# Patient Record
Sex: Male | Born: 1950 | Race: White | Hispanic: No | Marital: Single | Smoking: Former smoker
Health system: Southern US, Community
[De-identification: ages and names within clinical notes are randomized; demographics above are authoritative.]

## PROBLEM LIST (undated history)

## (undated) DIAGNOSIS — E2749 Other adrenocortical insufficiency: Secondary | ICD-10-CM

## (undated) DIAGNOSIS — E7800 Pure hypercholesterolemia, unspecified: Secondary | ICD-10-CM

## (undated) DIAGNOSIS — E230 Hypopituitarism: Secondary | ICD-10-CM

## (undated) DIAGNOSIS — R251 Tremor, unspecified: Secondary | ICD-10-CM

## (undated) DIAGNOSIS — R51 Headache: Secondary | ICD-10-CM

## (undated) DIAGNOSIS — T8859XA Other complications of anesthesia, initial encounter: Secondary | ICD-10-CM

## (undated) DIAGNOSIS — R112 Nausea with vomiting, unspecified: Secondary | ICD-10-CM

## (undated) DIAGNOSIS — Z79899 Other long term (current) drug therapy: Secondary | ICD-10-CM

## (undated) DIAGNOSIS — D444 Neoplasm of uncertain behavior of craniopharyngeal duct: Secondary | ICD-10-CM

## (undated) DIAGNOSIS — R439 Unspecified disturbances of smell and taste: Secondary | ICD-10-CM

## (undated) DIAGNOSIS — R430 Anosmia: Secondary | ICD-10-CM

## (undated) DIAGNOSIS — E039 Hypothyroidism, unspecified: Secondary | ICD-10-CM

## (undated) DIAGNOSIS — N200 Calculus of kidney: Secondary | ICD-10-CM

## (undated) DIAGNOSIS — E291 Testicular hypofunction: Secondary | ICD-10-CM

## (undated) DIAGNOSIS — T4145XA Adverse effect of unspecified anesthetic, initial encounter: Secondary | ICD-10-CM

## (undated) DIAGNOSIS — Z1211 Encounter for screening for malignant neoplasm of colon: Secondary | ICD-10-CM

## (undated) DIAGNOSIS — Z0000 Encounter for general adult medical examination without abnormal findings: Secondary | ICD-10-CM

## (undated) DIAGNOSIS — D443 Neoplasm of uncertain behavior of pituitary gland: Secondary | ICD-10-CM

## (undated) DIAGNOSIS — Z9889 Other specified postprocedural states: Secondary | ICD-10-CM

## (undated) DIAGNOSIS — M8580 Other specified disorders of bone density and structure, unspecified site: Secondary | ICD-10-CM

## (undated) DIAGNOSIS — M5430 Sciatica, unspecified side: Secondary | ICD-10-CM

## (undated) DIAGNOSIS — R259 Unspecified abnormal involuntary movements: Secondary | ICD-10-CM

## (undated) HISTORY — DX: Neoplasm of uncertain behavior of craniopharyngeal duct: D44.4

## (undated) HISTORY — DX: Other adrenocortical insufficiency: E27.49

## (undated) HISTORY — PX: CRANIOTOMY: SHX93

## (undated) HISTORY — DX: Headache: R51

## (undated) HISTORY — DX: Neoplasm of uncertain behavior of pituitary gland: D44.3

## (undated) HISTORY — DX: Testicular hypofunction: E29.1

## (undated) HISTORY — DX: Encounter for screening for malignant neoplasm of colon: Z12.11

## (undated) HISTORY — DX: Hypopituitarism: E23.0

## (undated) HISTORY — DX: Other long term (current) drug therapy: Z79.899

## (undated) HISTORY — DX: Calculus of kidney: N20.0

## (undated) HISTORY — DX: Anosmia: R43.0

## (undated) HISTORY — DX: Other specified disorders of bone density and structure, unspecified site: M85.80

## (undated) HISTORY — DX: Unspecified abnormal involuntary movements: R25.9

## (undated) HISTORY — DX: Hypothyroidism, unspecified: E03.9

## (undated) HISTORY — DX: Pure hypercholesterolemia, unspecified: E78.00

## (undated) HISTORY — DX: Encounter for general adult medical examination without abnormal findings: Z00.00

## (undated) HISTORY — DX: Tremor, unspecified: R25.1

## (undated) HISTORY — DX: Sciatica, unspecified side: M54.30

## (undated) HISTORY — DX: Unspecified disturbances of smell and taste: R43.9

## (undated) HISTORY — PX: OTHER SURGICAL HISTORY: SHX169

---

## 2001-12-24 ENCOUNTER — Encounter: Payer: Self-pay | Admitting: Ophthalmology

## 2001-12-24 ENCOUNTER — Ambulatory Visit (HOSPITAL_COMMUNITY): Admission: RE | Admit: 2001-12-24 | Discharge: 2001-12-24 | Payer: Self-pay | Admitting: Ophthalmology

## 2001-12-27 ENCOUNTER — Encounter: Payer: Self-pay | Admitting: Neurosurgery

## 2001-12-31 ENCOUNTER — Inpatient Hospital Stay (HOSPITAL_COMMUNITY): Admission: RE | Admit: 2001-12-31 | Discharge: 2002-01-07 | Payer: Self-pay | Admitting: Neurosurgery

## 2002-01-08 ENCOUNTER — Inpatient Hospital Stay (HOSPITAL_COMMUNITY): Admission: AD | Admit: 2002-01-08 | Discharge: 2002-01-11 | Payer: Self-pay | Admitting: Neurosurgery

## 2002-11-03 ENCOUNTER — Encounter: Payer: Self-pay | Admitting: Neurosurgery

## 2002-11-05 ENCOUNTER — Inpatient Hospital Stay (HOSPITAL_COMMUNITY): Admission: RE | Admit: 2002-11-05 | Discharge: 2002-11-14 | Payer: Self-pay | Admitting: Neurosurgery

## 2002-11-05 ENCOUNTER — Encounter: Payer: Self-pay | Admitting: Neurosurgery

## 2003-11-25 ENCOUNTER — Emergency Department (HOSPITAL_COMMUNITY): Admission: EM | Admit: 2003-11-25 | Discharge: 2003-11-25 | Payer: Self-pay | Admitting: Emergency Medicine

## 2005-11-20 HISTORY — PX: OTHER SURGICAL HISTORY: SHX169

## 2006-06-15 ENCOUNTER — Inpatient Hospital Stay (HOSPITAL_COMMUNITY): Admission: RE | Admit: 2006-06-15 | Discharge: 2006-07-01 | Payer: Self-pay | Admitting: Neurosurgery

## 2006-06-15 ENCOUNTER — Encounter (INDEPENDENT_AMBULATORY_CARE_PROVIDER_SITE_OTHER): Payer: Self-pay | Admitting: *Deleted

## 2010-02-17 ENCOUNTER — Encounter: Payer: Self-pay | Admitting: Family Medicine

## 2010-02-17 LAB — CONVERTED CEMR LAB
ALT: 31 units/L
AST: 25 units/L
Alkaline Phosphatase: 38 units/L
Cholesterol: 147 mg/dL
Direct LDL: 81 mg/dL
HDL: 44 mg/dL
Triglycerides: 145 mg/dL

## 2010-05-09 ENCOUNTER — Encounter: Admission: RE | Admit: 2010-05-09 | Discharge: 2010-05-09 | Payer: Self-pay | Admitting: Neurosurgery

## 2010-12-11 ENCOUNTER — Encounter: Payer: Self-pay | Admitting: Neurosurgery

## 2011-01-06 ENCOUNTER — Encounter: Payer: Self-pay | Admitting: Family Medicine

## 2011-01-20 ENCOUNTER — Encounter: Payer: Self-pay | Admitting: Family Medicine

## 2011-01-20 ENCOUNTER — Encounter (INDEPENDENT_AMBULATORY_CARE_PROVIDER_SITE_OTHER): Payer: BC Managed Care – PPO | Admitting: Family Medicine

## 2011-01-20 ENCOUNTER — Encounter (INDEPENDENT_AMBULATORY_CARE_PROVIDER_SITE_OTHER): Payer: Self-pay | Admitting: *Deleted

## 2011-01-20 DIAGNOSIS — J45909 Unspecified asthma, uncomplicated: Secondary | ICD-10-CM | POA: Insufficient documentation

## 2011-01-20 DIAGNOSIS — R519 Headache, unspecified: Secondary | ICD-10-CM | POA: Insufficient documentation

## 2011-01-20 DIAGNOSIS — M5430 Sciatica, unspecified side: Secondary | ICD-10-CM | POA: Insufficient documentation

## 2011-01-20 DIAGNOSIS — D444 Neoplasm of uncertain behavior of craniopharyngeal duct: Secondary | ICD-10-CM

## 2011-01-20 DIAGNOSIS — E291 Testicular hypofunction: Secondary | ICD-10-CM | POA: Insufficient documentation

## 2011-01-20 DIAGNOSIS — E7800 Pure hypercholesterolemia, unspecified: Secondary | ICD-10-CM | POA: Insufficient documentation

## 2011-01-20 DIAGNOSIS — R51 Headache: Secondary | ICD-10-CM | POA: Insufficient documentation

## 2011-01-20 DIAGNOSIS — Z0000 Encounter for general adult medical examination without abnormal findings: Secondary | ICD-10-CM

## 2011-01-20 DIAGNOSIS — M899 Disorder of bone, unspecified: Secondary | ICD-10-CM | POA: Insufficient documentation

## 2011-01-20 DIAGNOSIS — R439 Unspecified disturbances of smell and taste: Secondary | ICD-10-CM | POA: Insufficient documentation

## 2011-01-20 DIAGNOSIS — E230 Hypopituitarism: Secondary | ICD-10-CM | POA: Insufficient documentation

## 2011-01-20 DIAGNOSIS — E039 Hypothyroidism, unspecified: Secondary | ICD-10-CM | POA: Insufficient documentation

## 2011-01-20 DIAGNOSIS — D443 Neoplasm of uncertain behavior of pituitary gland: Secondary | ICD-10-CM | POA: Insufficient documentation

## 2011-01-20 DIAGNOSIS — E2749 Other adrenocortical insufficiency: Secondary | ICD-10-CM | POA: Insufficient documentation

## 2011-01-20 DIAGNOSIS — R251 Tremor, unspecified: Secondary | ICD-10-CM | POA: Insufficient documentation

## 2011-01-20 DIAGNOSIS — M949 Disorder of cartilage, unspecified: Secondary | ICD-10-CM

## 2011-01-20 DIAGNOSIS — Z87442 Personal history of urinary calculi: Secondary | ICD-10-CM | POA: Insufficient documentation

## 2011-01-24 ENCOUNTER — Encounter: Payer: Self-pay | Admitting: Family Medicine

## 2011-01-26 NOTE — Assessment & Plan Note (Signed)
Summary: CPX/PT TRANSFER FROM [facility]   Vital Signs:  Patient profile:   60 year old male Height:      69 inches Weight:      214.25 pounds BMI:     31.75 Temp:     98.4 degrees F oral Pulse rate:   64 / minute Pulse rhythm:   regular BP sitting:   114 / 70  (left arm) Cuff size:   large  Vitals Entered By: Delilah Shan CMA Duncan Dull) (January 20, 2011 8:42 AM) CC: CPX - Transfer from [place]   History of Present Illness: Here to est care/CPE.  See plan.   Panhypopit and h/o crainopharyngioma.  Has had follow up with Dr. Channing Mutters and is doing well. He didn't get a MRI at last OV.  He had one last June and the lesion wasn't expanding.  Has still been following with Dr. Talmage Nap.  DXA per endo.  Anosmia continues and this has been a problem for the patient.    Elevated Cholesterol: Using medications without problems:yes Muscle aches: no Other complaints:no.  due for labs.   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 Talmage Nap SCREENING, COLON CANCER (ICD-V76.51) PHYSICAL EXAMINATION (ICD-V70.0) HYPERCHOLESTEROLEMIA (ICD-272.0) Nephrolithiasis, hx of Frequent headaches H/o asthma Osteopenia  Balan- endo Roy-neurosurg Rankin-ophtho  Past Surgical History: craniotomy x3- Dr. Karma Greaser knife Apr 03, 2006 L middle finger mallet injury  Family History: Reviewed history and no changes required. aunt was his adopted mother, dead Apr 03, 2010 from leukemia F- no information biological mother, healthy 6 biological sibs, 2 with breast cancer, both in remission  Social History: Reviewed history and no changes required. 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  Otherwise negative.    Physical Exam  General:  GEN: nad, alert and oriented HEENT: mucous membranes moist NECK: supple w/o LA CV: rrr.  no murmur PULM: ctab, no inc wob ABD: soft, +bs EXT: no edema SKIN: no acute rash  mild tremor noted in head/neck but not in arms/hands Prostate:  Prostate gland firm and smooth, no enlargement, nodularity, tenderness, mass, asymmetry or induration.   Impression & Recommendations:  Problem # 1:  PHYSICAL EXAMINATION (ICD-V70.0) d/w patient about healthy diet and exercise.  he is likely up to date on tetanus per Dr. Talmage Nap.  Requesting records.  refer to GI for colon CA screen.  Flu and PNA up to date.  Will arrange for labs.    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  per endo  Problem # 4:  HYPERCHOLESTEROLEMIA (ICD-272.0)  No change in meds.  Labs to be done soon.  His updated medication list for this problem includes:    Pravastatin Sodium 40 Mg Tabs (Pravastatin sodium) .Marland Kitchen... Take 1 tablet by mouth once a day  Orders: Prescription Created Electronically [phone])  Problem # 5:  LONG-TERM (CURRENT) USE OF OTHER MEDICATIONS (ICD-V58.69) DRE wnl, check psa since he is on testosterone.   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 Channing Mutters.   Complete Medication List: 1)  Hydrocortisone 20 Mg Tabs (Hydrocortisone) .... Take 1 tablet by  mouth every morning (double this if physically stressed) 2)  Hydrocortisone 10 Mg Tabs (Hydrocortisone) .... Take 1 tablet each evening (double dose if physically stressed) 3)  Pravastatin Sodium 40 Mg Tabs (Pravastatin sodium) .... Take 1 tablet by mouth once a day 4)  Bromocriptine Mesylate 2.5 Mg Tabs (Bromocriptine mesylate) .... Take 1 tablet by  mouth once a day 5)  Lyrica 75 Mg Caps (Pregabalin) .... Take 1 tablet by mouth two times a day 6)  Synthroid 88 Mcg Tabs (Levothyroxine sodium) .... Take 1 tablet by mouth once a day 7)  Hydrocodone-acetaminophen 5-500 Mg Tabs (Hydrocodone-acetaminophen) .... Take 1/2 tablet by mouth three times a day as needed 8)  Centrum Silver Ultra Mens Tabs (Multiple vitamins-minerals) .... Take 1 tablet by mouth once a day 9)  Calcium Citrate-vitamin D 250-50 Mg-unit Tabs (Calcium citrate-vitamin d) .Marland Kitchen.. 1260 mg. calcium/1000 international units vitamin d once daily 10)  Aspirin 81 Mg Tbec (Aspirin) .... Take 1 tablet by mouth once a day 11)  Depo-testosterone 100 Mg/ml Oil (Testosterone cypionate) .... ? strength     0.3 ml injection every 10 days 12)  Cyclobenzaprine Hcl 10 Mg Tabs (Cyclobenzaprine hcl) .... As needed, rarely ever 13)  Dexamethasone Intensol 1 Mg/ml Conc (Dexamethasone) .... Syringe loaded in pocket to be injected in thigh if not responding.  Patient Instructions: 1)  Don't change your meds.  Call about getting your labs done at [facility]. 2)  cmet/lipid 272.0 3)  PSA v58.69.  4)  See Shirlee Limerick about your referral before your leave today.   5)  OV in 6 months.  6)  Try to walk more and cut back on sugary foods.   7)  Glad to see you.  Call me with concerns.  Take care.  Prescriptions: PRAVASTATIN SODIUM 40 MG TABS (PRAVASTATIN SODIUM) Take 1 tablet by mouth once a day  #90 x 3   Entered and Authorized by:   Crawford Givens MD   Signed by:   Crawford Givens MD on 01/20/2011   Method used:   Electronically to        [facility]* (retail)       [address]       [place], [place]  [id number]       Ph: [phone]       Fax: [phone]   RxID:   [id number]    Orders Added: 1)  Est. Patient 40-64 years [99396] 2)  Est. Patient Level III [43329] 3)  Gastroenterology Referral [GI] 4)  Prescription Created Electronically [phone]   Immunization  History:  Pneumovax Immunization History:    Pneumovax:  historical (11/20/2009)  Influenza Immunization History:    Influenza:  historical (08/20/2010)   Immunization History:  Pneumovax Immunization History:    Pneumovax:  Historical (11/20/2009)  Influenza Immunization History:    Influenza:  Historical (08/20/2010)  Current Allergies (reviewed today): No known allergies

## 2011-01-26 NOTE — Letter (Signed)
Summary: Pre Visit Letter Revised  [facility]  [address] [place], [place] [address]   Phone: [phone]  Fax: [phone]        01/20/2011 MRN: [id number] [facility] [address] [place], [place]  [address]  [place]             Procedure Date:  02-20-11           Direct Colon---Dr. Christella Hartigan   Welcome to the Gastroenterology Division at [facility].    You are scheduled to see a nurse for your pre-procedure visit on 02-06-11 at 4:30p.m. on the 3rd floor at [facility], [address].  We ask that you try to arrive at our office 15 minutes prior to your appointment time to allow for check-in.  Please take a minute to review the attached form.  If you answer "Yes" to one or more of the questions on the first page, we ask that you call the person listed at your earliest opportunity.  If you answer "No" to all of the questions, please complete the rest of the form and bring it to your appointment.    Your nurse visit will consist of discussing your medical and surgical history, your immediate family medical history, and your medications.   If you are unable to list all of your medications on the form, please bring the medication bottles to your appointment and we will list them.  We will need to be aware of both prescribed and over the counter drugs.  We will need to know exact dosage information as well.    Please be prepared to read and sign documents such as consent forms, a financial agreement, and acknowledgement forms.  If necessary, and with your consent, a friend or relative is welcome to sit-in on the nurse visit with you.  Please bring your insurance card so that we may make a copy of it.  If your insurance requires a referral to see a specialist, please bring your referral form from your primary care physician.  No co-pay is required for this nurse visit.     If you cannot keep your appointment, please call [phone] to cancel or reschedule  prior to your appointment date.  This allows [place] the opportunity to schedule an appointment for another patient in need of care.    Thank you for choosing [facility] for your medical needs.  We appreciate the opportunity to care for you.  Please visit [place] at our website  to learn more about our practice.  Sincerely, The Gastroenterology Division

## 2011-01-31 NOTE — Letter (Signed)
Summary: Records from [facility] 2007 - 2011  Records from [facility] 2007 - 2011   Imported By: Maryln Gottron 01/26/2011 14:40:52  _____________________________________________________________________  External Attachment:    Type:   Image     Comment:   External Document

## 2011-01-31 NOTE — Miscellaneous (Signed)
  Clinical Lists Changes  Observations: Added new observation of SGPT (ALT): 31 units/L (02/17/2010 15:53) Added new observation of SGOT (AST): 25 units/L (02/17/2010 15:53) Added new observation of ALK PHOS: 38 units/L (02/17/2010 15:53) Added new observation of LDL DIR: 81 mg/dL (81/19/1478 29:56) Added new observation of HDL: 44 mg/dL (21/30/8657 84:69) Added new observation of TRIGLYC TOT: 145 mg/dL (62/95/2841 32:44) Added new observation of CHOLESTEROL: 147 mg/dL (11/22/7251 66:44)

## 2011-02-06 ENCOUNTER — Encounter (INDEPENDENT_AMBULATORY_CARE_PROVIDER_SITE_OTHER): Payer: Self-pay | Admitting: *Deleted

## 2011-02-06 ENCOUNTER — Encounter: Payer: Self-pay | Admitting: *Deleted

## 2011-02-16 NOTE — Letter (Signed)
Summary: New Patient letter  [facility]  [address] [place], [place] [address]   Phone: [phone]  Fax: [phone]       02/06/2011 MRN: [id number]  [facility] [address] [place], [place]  [address]  [place]  Dear [name],  Welcome to the Gastroenterology Division at [facility].    You are scheduled to see Dr.  Christella Hartigan on 03-20-11 at 9:00A.M. on the 3rd floor at [facility], [address].  We ask that you try to arrive at our office 15 minutes prior to your appointment time to allow for check-in.  We would like you to complete the enclosed self-administered evaluation form prior to your visit and bring it with you on the day of your appointment.  We will review it with you.  Also, please bring a complete list of all your medications or, if you prefer, bring the medication bottles and we will list them.  Please bring your insurance card so that we may make a copy of it.  If your insurance requires a referral to see a specialist, please bring your referral form from your primary care physician.  Co-payments are due at the time of your visit and may be paid by cash, check or credit card.     Your office visit will consist of a consult with your physician (includes a physical exam), any laboratory testing he/she may order, scheduling of any necessary diagnostic testing (e.g. x-ray, ultrasound, CT-scan), and scheduling of a procedure (e.g. Endoscopy, Colonoscopy) if required.  Please allow enough time on your schedule to allow for any/all of these possibilities.    If you cannot keep your appointment, please call [phone] to cancel or reschedule prior to your appointment date.  This allows [place] the opportunity to schedule an appointment for another patient in need of care.  If you do not cancel or reschedule by 5 p.m. the business day prior to your appointment date, you will be charged a $50.00 late cancellation/no-show fee.    Thank you for  choosing [facility] for your medical needs.  We appreciate the opportunity to care for you.  Please visit [place] at our website  to learn more about our practice.                     Sincerely,                                                             The Gastroenterology Division

## 2011-02-16 NOTE — Miscellaneous (Signed)
Summary: [facility] PV/needs OV  Clinical Lists Changes  Observations: Added new observation of NKA: T (02/06/2011 16:01)    Pt here for PV for screening colonoscopy.  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 2003, and 2005.  Radiation in 2007.  Pt is on multiple drugs for management of tumor.  Pt has concerns about nausea w/ sedation. He also says that he is unable to take his daily Prednisone if he is not eating and wants injection during the time before procedure that he is not able to eat.  Pt's colonoscopy was cancelled and OV w/ Dr. Christella Hartigan was scheduled for 03/20/2011 at 9:00 am.

## 2011-02-20 ENCOUNTER — Other Ambulatory Visit: Payer: BC Managed Care – PPO | Admitting: Gastroenterology

## 2011-03-15 DIAGNOSIS — Z1211 Encounter for screening for malignant neoplasm of colon: Secondary | ICD-10-CM

## 2011-03-15 NOTE — Progress Notes (Signed)
error    This encounter was created in error - please disregard.

## 2011-03-20 ENCOUNTER — Ambulatory Visit: Payer: BC Managed Care – PPO | Admitting: Gastroenterology

## 2011-03-28 ENCOUNTER — Encounter: Payer: Self-pay | Admitting: Family Medicine

## 2011-04-07 NOTE — H&P (Signed)
NAME:  Brian Stokes, Brian Stokes NO.:  [id number]   MEDICAL RECORD NO.:  [id number]          PATIENT TYPE:  INP   LOCATION:  NA                           FACILITY:  [facility]   PHYSICIAN:  Payton Doughty, M.D.      DATE OF BIRTH:  Apr 08, 1951   DATE OF ADMISSION:  06/15/2006  DATE OF DISCHARGE:                                HISTORY & PHYSICAL   ADMISSION DIAGNOSIS:  Recurrent pituitary tumor.   BODY OF TEXT:  A very nice 60 year old right-handed white gentleman whom I  operated on 4-1/2 years ago for a pituitary tumor via a left temporal  craniotomy.  Subsequently 8 months later he had a transsphenoidal procedure.  He had good decompression but has had increased tumor size and compression  of the optic chiasm.  He is being set up for Gamma knife and is now admitted  for craniotomy to allow a margin of safety between the tumor and the optic  chiasm for Gamma knife.   His medical history is otherwise fairly unremarkable.   He takes hydrocortisone, Synthroid, testosterone, Pravastatin.   He has no allergies.   SURGICAL HISTORY:  Prior pituitary operations.   PHYSICAL EXAMINATION:  HEENT:  Within normal limits.  NECK:  He has good range of motion of his neck.  CHEST:  Clear.  CARDIAC:  Regular rate and rhythm.  NEUROLOGIC:  He is awake, alert and oriented.  He has a left temporal field  cut of approximately 15-30 degrees to confrontational testing.  The right  side appears to be normal.  His pupils equal, round, and reactive to light.  Extraocular movements are intact.  Facial movement and sensation intact.  Tongue protrudes in the midline.  Shoulder shrug is normal.  Motor exam is  5/5 throughout with no drift.   MR shows pituitary tumor with elevation of the chiasm.   CLINICAL IMPRESSION:  Recurrent pituitary adenoma.   PLAN:  Craniotomy to get the tumor off of the chiasm.  The risks and  benefits of this approach have been discussed with him and he wishes to   proceed.           ______________________________  Payton Doughty, M.D.     MWR/MEDQ  D:  06/15/2006  T:  06/15/2006  Job:  [id number]

## 2011-04-07 NOTE — Discharge Summary (Addendum)
Brian Stokes, Brian Stokes NO.:  [id number]   MEDICAL RECORD NO.:  [id number]          PATIENT TYPE:  INP   LOCATION:  3021                         FACILITY:  [facility]   PHYSICIAN:  Payton Doughty, M.D.      DATE OF BIRTH:  03-30-1951   DATE OF ADMISSION:  06/15/2006  DATE OF DISCHARGE:                                 DISCHARGE SUMMARY   DISCHARGE DATE:  July 01, 2006   ADMITTING DIAGNOSIS:  Recurrent pituitary tumor.   DISCHARGE DIAGNOSIS:  Recurrent pituitary tumor.   PROCEDURE:  Right pterional craniotomy for tumor resection.   BODY OF TEXT:  This is a 60 year old right-handed white gentleman whose  history and physical is recounted on the chart.  He has had a left  craniotomy and a transsphenoidal operation for pituitary tumor.  Has been  set up for gamma knife but has some tumor compressing his chiasm, and for  safety was going to have a resection of this portion of the tumor so that he  could go for gamma knife.  Medical history is otherwise unremarkable.  Takes  hydrazone, Synthroid, testosterone, pravastatin.   ALLERGIES:  None.   General exam was intact.  Neurologic exam was intact with a slight amount of  visual deficit in the right eye.  He was admitted after ____ QA MARKER: 60                                               ____ values and underwent the right pterional craniotomy for  the tumor.  The optic chiasm was well decompressed postoperatively.  He had  a fair amount of headache and pain and nausea and vomiting that persisted  for about a 10-day period.  It was gotten under control finally with IV  Zofran.  His repeat CT scan demonstrated a small amount of subdural blood  but nothing requiring operative intervention.  He also developed pain in his  back and some left sciatica.  CT scanning of the lumbar spine simply  demonstrated a lot of facet arthropathy and starting him on Lyrica made a  big difference for this.  Finally on June 28, 2006, it  was feasible to  taper his Decadron, was tapered down to 2 and then off yesterday.  Currently  he is awake, alert, and oriented.  His visual fields are full with slight  blurriness of the vision in the right eye.  He is back on his baseline  medications of hydrocortisone, Synthroid, testosterone, and bromocriptine.  He is being discharged home to the care of his family and to have an MRI  arranged in about a week for gamma knife.           ______________________________  Payton Doughty, M.D.     MWR/MEDQ  D:  07/01/2006  T:  07/01/2006  Job:  [id number]

## 2011-04-07 NOTE — Op Note (Signed)
NAMESHIVEN, JUNIOUS NO.:  [id number]   MEDICAL RECORD NO.:  [id number]          PATIENT TYPE:  INP   LOCATION:  2628                         FACILITY:  [facility]   PHYSICIAN:  Payton Doughty, M.D.      DATE OF BIRTH:  1951-04-10   DATE OF PROCEDURE:  06/15/2006  DATE OF DISCHARGE:                                 OPERATIVE REPORT   PREOPERATIVE DIAGNOSIS:  Pituitary tumor, compression of the optic chiasm.   POSTOPERATIVE DIAGNOSIS:  Pituitary tumor, compression of the optic chiasm.   OPERATIVE PROCEDURE:  Right pterional craniotomy for resection of pituitary  tumor and decompression of optic chiasm.   SURGEON:  Dr. Channing Mutters   ANESTHESIA:  General endotracheal.   PREP:  __________ alcohol wipe.   COMPLICATIONS:  None.   NURSE ASSISTANT:  __________.   DOCTOR ASSISTANT:  Ezzard Standing   This is a 60 year old right-handed with gentleman with pituitary tumor and  compression of the optic chiasm, taken to the operating suite, anesthetized  and intubated, and placed supine on the operating room table in the [place]  headholder with the head turned towards the left, right side up, with the  malar eminence being the high point in the field.  Following shave, prep and  drape in the usual sterile fashion, skin was infiltrated with 1% lidocaine  with 1:400,000 epinephrine, started at the zygoma on the right, carried  straight superiorly to the superotemporal line and then forward at a 45-  degree angle to the hairline.  The scalp was opened, and the temporalis  muscle taken down with the scalp flap forward.  Bur holes were created over  the pterion, and the bone flap elevated centered at the tip of the sphenoid  wing.  Sphenoid wing was taken down extradurally.  The dura was then opened,  clamped back with the skin flap.  The frontal lobe was elevated, and the  sylvian fissure divided until the olfactory nerve was visualized grossly.  On the temporal side, 2 temporal bridging  veins were taken down in gentle  retraction inferiorly and laterally taken on the temporal tip.  As the  sylvian fissure was opened, the optic nerve and the carotid were evident.  Lysis of the arachnoid between the optic nerve and the frontal lobe was  carried out so that the frontal lobe could be elevated.  Once the frontal  lobe was elevated, the optic nerve back to the chiasm was identified, as  well as the optic tract posteriorly from the chiasm was identified, working  between the optic apparatus and the carotid, the tumor was bipolared.  Then  the micro-CUSA was brought in to resect it.  Ring curette was used to remove  more fragments of the tumor.  This resulted in immediate decompression of  the tumor.  The optic nerve, which had been flattened assumed a more,  rounded configuration.  Following complete decompression, the diaphragmatic  sella was left intact.  The wound was irrigated.  Hemostasis assured.  Dural  tacking sutures were placed, and then the dura was  reapproximated with 4-0 Nurolon.  The flap was replaced with the plates and  screws.  Donnetta Hutching was reapproximated with 2-0 Vicryl in interrupted fashion.  Skin was closed with 3-0 nylon in running, locked fashion.  Betadine Telfa  dressing was applied and a standard head wrap, and the patient returned to  the recovery room.           ______________________________  Payton Doughty, M.D.     MWR/MEDQ  D:  06/15/2006  T:  06/16/2006  Job:  [id number]

## 2011-07-25 ENCOUNTER — Ambulatory Visit: Payer: BC Managed Care – PPO | Admitting: Family Medicine

## 2011-07-31 ENCOUNTER — Ambulatory Visit: Payer: BC Managed Care – PPO | Admitting: Family Medicine

## 2012-01-12 ENCOUNTER — Encounter: Payer: Self-pay | Admitting: Gastroenterology

## 2012-01-23 ENCOUNTER — Other Ambulatory Visit: Payer: Self-pay | Admitting: Family Medicine

## 2012-01-26 ENCOUNTER — Other Ambulatory Visit: Payer: Self-pay | Admitting: Family Medicine

## 2012-01-26 DIAGNOSIS — E7800 Pure hypercholesterolemia, unspecified: Secondary | ICD-10-CM

## 2012-01-26 MED ORDER — PRAVASTATIN SODIUM 40 MG PO TABS: 40.0000 mg | ORAL_TABLET | Freq: Every day | ORAL | Status: DC

## 2012-01-26 NOTE — Progress Notes (Signed)
Pt needed labs before OV.  Pravastatin sent in.

## 2012-01-30 ENCOUNTER — Ambulatory Visit: Payer: BC Managed Care – PPO | Admitting: Gastroenterology

## 2012-04-25 ENCOUNTER — Other Ambulatory Visit (INDEPENDENT_AMBULATORY_CARE_PROVIDER_SITE_OTHER): Payer: BC Managed Care – PPO

## 2012-04-25 DIAGNOSIS — E7800 Pure hypercholesterolemia, unspecified: Secondary | ICD-10-CM

## 2012-04-25 LAB — COMPREHENSIVE METABOLIC PANEL
ALT: 36 U/L (ref 0–53)
AST: 36 U/L (ref 0–37)
Albumin: 3.6 g/dL (ref 3.5–5.2)
Alkaline Phosphatase: 40 U/L (ref 39–117)
BUN: 18 mg/dL (ref 6–23)
CO2: 30 mEq/L (ref 19–32)
Calcium: 8.8 mg/dL (ref 8.4–10.5)
Chloride: 108 mEq/L (ref 96–112)
Creatinine, Ser: 0.8 mg/dL (ref 0.4–1.5)
GFR: 105.05 mL/min (ref >60.00)
Glucose, Bld: 67 mg/dL — ABNORMAL LOW (ref 70–99)
Potassium: 4.1 mEq/L (ref 3.5–5.1)
Sodium: 143 mEq/L (ref 135–145)
Total Bilirubin: 0.7 mg/dL (ref 0.3–1.2)
Total Protein: 5.6 g/dL — ABNORMAL LOW (ref 6.0–8.3)

## 2012-04-25 LAB — LIPID PANEL
Cholesterol: 134 mg/dL (ref 0–200)
HDL: 53.5 mg/dL (ref >39.00)
LDL Cholesterol: 66 mg/dL (ref 0–99)
Total CHOL/HDL Ratio: 3
Triglycerides: 73 mg/dL (ref 0.0–149.0)
VLDL: 14.6 mg/dL (ref 0.0–40.0)

## 2012-04-26 ENCOUNTER — Encounter: Payer: Self-pay | Admitting: *Deleted

## 2012-07-25 ENCOUNTER — Other Ambulatory Visit: Payer: Self-pay | Admitting: Family Medicine

## 2012-07-25 NOTE — Telephone Encounter (Signed)
Refill request for Pravachol 40 mg. Last filled 01/26/12. Ok to refill?

## 2012-07-26 NOTE — Telephone Encounter (Signed)
Sent!

## 2012-09-16 ENCOUNTER — Encounter: Payer: Self-pay | Admitting: Family Medicine

## 2012-09-17 ENCOUNTER — Encounter: Payer: Self-pay | Admitting: Family Medicine

## 2012-09-17 ENCOUNTER — Ambulatory Visit (INDEPENDENT_AMBULATORY_CARE_PROVIDER_SITE_OTHER): Payer: BC Managed Care – PPO | Admitting: Family Medicine

## 2012-09-17 VITALS — BP 110/70 | HR 64 | Temp 97.7°F | Ht 69.0 in | Wt 174.0 lb

## 2012-09-17 DIAGNOSIS — Z23 Encounter for immunization: Secondary | ICD-10-CM

## 2012-09-17 DIAGNOSIS — D444 Neoplasm of uncertain behavior of craniopharyngeal duct: Secondary | ICD-10-CM

## 2012-09-17 DIAGNOSIS — D443 Neoplasm of uncertain behavior of pituitary gland: Secondary | ICD-10-CM

## 2012-09-17 DIAGNOSIS — R259 Unspecified abnormal involuntary movements: Secondary | ICD-10-CM

## 2012-09-17 DIAGNOSIS — Z1211 Encounter for screening for malignant neoplasm of colon: Secondary | ICD-10-CM

## 2012-09-17 DIAGNOSIS — Z0000 Encounter for general adult medical examination without abnormal findings: Secondary | ICD-10-CM | POA: Insufficient documentation

## 2012-09-17 DIAGNOSIS — E230 Hypopituitarism: Secondary | ICD-10-CM

## 2012-09-17 DIAGNOSIS — E7800 Pure hypercholesterolemia, unspecified: Secondary | ICD-10-CM

## 2012-09-17 NOTE — Progress Notes (Signed)
CPE- See plan.  Routine anticipatory guidance given to patient.  See health maintenance. Colonoscopy due- referral ordered.  PSA per endo per patient.  Flu shot 2013 PNA shot 2011 Tetanus per old records had been done at endo clinic.  Advance directive d/w pt.  He'll consider.   Losing weight with diet.  Weight has stabilized and he feels much better.   H/o craniopharyngioma and panhypopit, has had f/u with endo, neurosurgery, and eye clinic.  Vision fields stable per patient.  No new neuro deficits noted by patient.  Anosmia continues at baseline.   Tremor is nonbothersome and at baseline.  No focal weakness.  Rare sciatica flare.    Elevated Cholesterol: Using medications without problems:yes Muscle aches: no Diet compliance:yes  PMH and SH reviewed  Meds, vitals, and allergies reviewed.   ROS: See HPI.  Otherwise negative.    GEN: nad, alert and oriented HEENT: mucous membranes moist NECK: supple w/o LA CV: rrr. PULM: ctab, no inc wob ABD: soft, +bs EXT: no edema SKIN: no acute rash CN 2-12 wnl B, S/S except for dec in sensation on R shin at baseline Head tremor noted, at baseline

## 2012-09-17 NOTE — Patient Instructions (Addendum)
Check with your insurance to see if they will cover the shingles shot at 62.  Ask Dr. Talmage Nap about getting it, too.  See Shirlee Limerick about your referral before you leave today. Take care.  Glad to see you.

## 2012-09-17 NOTE — Assessment & Plan Note (Signed)
Routine anticipatory guidance given to patient.  See health maintenance. Colonoscopy due- referral ordered.  PSA per endo per patient.  Flu shot 2013 PNA shot 2011 Tetanus per old records had been done at endo clinic.  Advance directive d/w pt.  He'll consider.   Losing weight with diet.  Weight has stabilized and he feels much better.

## 2012-09-17 NOTE — Assessment & Plan Note (Signed)
At baseline and he'll notify me if worsening.

## 2012-09-17 NOTE — Assessment & Plan Note (Signed)
Controlled, continue current meds.  Labs d/w pt.  He has lost weight intentionally.

## 2012-09-17 NOTE — Assessment & Plan Note (Signed)
Per endo °

## 2012-09-17 NOTE — Assessment & Plan Note (Signed)
Doing well with appropriate f/u. I will await consult notes.

## 2013-08-13 ENCOUNTER — Other Ambulatory Visit: Payer: Self-pay | Admitting: Family Medicine

## 2013-08-14 NOTE — Telephone Encounter (Signed)
Rout to [place], Dr. Lianne Bushy assistant

## 2013-10-07 ENCOUNTER — Encounter: Payer: Self-pay | Admitting: Family Medicine

## 2013-10-07 ENCOUNTER — Ambulatory Visit (INDEPENDENT_AMBULATORY_CARE_PROVIDER_SITE_OTHER): Payer: BC Managed Care – PPO | Admitting: Family Medicine

## 2013-10-07 VITALS — BP 110/80 | HR 60 | Temp 98.1°F | Wt 176.0 lb

## 2013-10-07 DIAGNOSIS — G4700 Insomnia, unspecified: Secondary | ICD-10-CM

## 2013-10-07 MED ORDER — ZOLPIDEM TARTRATE 10 MG PO TABS: 10.0000 mg | ORAL_TABLET | Freq: Every evening | ORAL | Status: DC | PRN

## 2013-10-07 NOTE — Patient Instructions (Signed)
Ask Dr. Talmage Nap about getting the shingles shot.   Take care.  Glad to see you.

## 2013-10-07 NOTE — Progress Notes (Signed)
Pre-visit discussion using our clinic review tool. No additional management support is needed unless otherwise documented below in the visit note.  He has had f/u with Dr. Channing Mutters and Dr. Talmage Nap. He does have lyrica for sciatica and usually takes it at night.    Insomnia.  Had been on ambien for an extended period of time. Prev rx'd by endo.  Would need to have it rx'd through here.  No ADE on the med.  Doesn't sleep well w/o out it.  He'll have trouble getting to sleep and then frequently wake w/o it.  With the med, he can get some sleep, wakes refreshed and no parasomnias. He isn't oversedated after taking it.  He can get up to 6 hours of sleep with 10mg  at night.  He never was one to have a long sleep requirement.  Without the medicine, he is fatigued the next day from lack of sleep w/o the medicine.  He tried Lobby[employer].    Colonoscopy wasn't done- discussed.  He is going to follow up on this.    He has been walking for exercise. Tremor is still variable.    Flu and PNA shot prev done.    Meds, vitals, and allergies reviewed.   ROS: See HPI.  Otherwise, noncontributory.  GEN: nad, alert and oriented NECK: supple w/o LA CV: rrr PULM: ctab, no inc wob ABD: soft, +bs EXT: no edema Tremor is at baseline.

## 2013-10-07 NOTE — Assessment & Plan Note (Signed)
Controlled with ambien w/o ADE.  Would be reasonable to continue.  Benefit appears to outweigh risk.  D/w pt.  No parasomnias.  Okay for outpatient f/u.  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

## 2013-10-28 ENCOUNTER — Other Ambulatory Visit: Payer: Self-pay | Admitting: Family Medicine

## 2013-10-28 MED ORDER — ZOLPIDEM TARTRATE 10 MG PO TABS: 10.0000 mg | ORAL_TABLET | Freq: Every evening | ORAL | Status: DC | PRN

## 2013-10-28 NOTE — Telephone Encounter (Signed)
Pt called back has been without med for 4 nights; advised pt when seen on 10/07/13 zolpidem rx was given to pt. Pt said he did not need refill then and did not understand that he was getting a prescription and pt has no idea where paperwork is from 10/07/13 visit. Pt request refill zolpidem to [facility] today. Pt request cb when called in .Please advise.

## 2013-10-28 NOTE — Telephone Encounter (Signed)
Please call in.  Thanks.   

## 2013-10-28 NOTE — Telephone Encounter (Signed)
Pt left v/m requesting zolpidem rx faxed to [facility]. Pt request cb when refilled.

## 2013-10-28 NOTE — Telephone Encounter (Signed)
Last filled 09/26/2013.

## 2013-10-28 NOTE — Telephone Encounter (Signed)
Medication phoned to pharmacy.  

## 2013-10-29 ENCOUNTER — Other Ambulatory Visit: Payer: Self-pay | Admitting: Family Medicine

## 2013-10-29 NOTE — Telephone Encounter (Signed)
Sent!

## 2013-11-20 ENCOUNTER — Observation Stay (HOSPITAL_COMMUNITY)
Admission: EM | Admit: 2013-11-20 | Discharge: 2013-11-22 | Disposition: A | Discharge status: Home / Self Care | Payer: BC Managed Care – PPO | Attending: Internal Medicine | Admitting: Internal Medicine

## 2013-11-20 ENCOUNTER — Emergency Department (HOSPITAL_COMMUNITY): Payer: BC Managed Care – PPO

## 2013-11-20 ENCOUNTER — Encounter (HOSPITAL_COMMUNITY): Payer: Self-pay | Admitting: Emergency Medicine

## 2013-11-20 DIAGNOSIS — M949 Disorder of cartilage, unspecified: Secondary | ICD-10-CM

## 2013-11-20 DIAGNOSIS — Z87442 Personal history of urinary calculi: Secondary | ICD-10-CM | POA: Insufficient documentation

## 2013-11-20 DIAGNOSIS — Z7982 Long term (current) use of aspirin: Secondary | ICD-10-CM | POA: Insufficient documentation

## 2013-11-20 DIAGNOSIS — E7800 Pure hypercholesterolemia, unspecified: Secondary | ICD-10-CM | POA: Insufficient documentation

## 2013-11-20 DIAGNOSIS — E2749 Other adrenocortical insufficiency: Secondary | ICD-10-CM

## 2013-11-20 DIAGNOSIS — E230 Hypopituitarism: Secondary | ICD-10-CM

## 2013-11-20 DIAGNOSIS — R55 Syncope and collapse: Principal | ICD-10-CM

## 2013-11-20 DIAGNOSIS — D443 Neoplasm of uncertain behavior of pituitary gland: Secondary | ICD-10-CM | POA: Diagnosis present

## 2013-11-20 DIAGNOSIS — E039 Hypothyroidism, unspecified: Secondary | ICD-10-CM

## 2013-11-20 DIAGNOSIS — D444 Neoplasm of uncertain behavior of craniopharyngeal duct: Secondary | ICD-10-CM

## 2013-11-20 DIAGNOSIS — E291 Testicular hypofunction: Secondary | ICD-10-CM | POA: Insufficient documentation

## 2013-11-20 DIAGNOSIS — G4700 Insomnia, unspecified: Secondary | ICD-10-CM | POA: Diagnosis present

## 2013-11-20 DIAGNOSIS — M5430 Sciatica, unspecified side: Secondary | ICD-10-CM | POA: Insufficient documentation

## 2013-11-20 DIAGNOSIS — Z79899 Other long term (current) drug therapy: Secondary | ICD-10-CM | POA: Insufficient documentation

## 2013-11-20 DIAGNOSIS — M899 Disorder of bone, unspecified: Secondary | ICD-10-CM | POA: Insufficient documentation

## 2013-11-20 DIAGNOSIS — I959 Hypotension, unspecified: Secondary | ICD-10-CM | POA: Insufficient documentation

## 2013-11-20 DIAGNOSIS — R51 Headache: Secondary | ICD-10-CM

## 2013-11-20 DIAGNOSIS — J45909 Unspecified asthma, uncomplicated: Secondary | ICD-10-CM | POA: Insufficient documentation

## 2013-11-20 HISTORY — DX: Adverse effect of unspecified anesthetic, initial encounter: T41.45XA

## 2013-11-20 HISTORY — DX: Other complications of anesthesia, initial encounter: T88.59XA

## 2013-11-20 HISTORY — DX: Other specified postprocedural states: R11.2

## 2013-11-20 HISTORY — DX: Nausea with vomiting, unspecified: Z98.890

## 2013-11-20 LAB — COMPREHENSIVE METABOLIC PANEL
ALT: 29 U/L (ref 0–53)
AST: 30 U/L (ref 0–37)
Albumin: 3.6 g/dL (ref 3.5–5.2)
Alkaline Phosphatase: 41 U/L (ref 39–117)
BUN: 22 mg/dL (ref 6–23)
CO2: 25 mEq/L (ref 19–32)
Calcium: 8.3 mg/dL — ABNORMAL LOW (ref 8.4–10.5)
Chloride: 107 mEq/L (ref 96–112)
Creatinine, Ser: 1.18 mg/dL (ref 0.50–1.35)
GFR calc Af Amer: 76 mL/min — ABNORMAL LOW (ref >90)
GFR calc non Af Amer: 65 mL/min — ABNORMAL LOW (ref >90)
Glucose, Bld: 105 mg/dL — ABNORMAL HIGH (ref 70–99)
Potassium: 3.4 mEq/L — ABNORMAL LOW (ref 3.7–5.3)
Sodium: 147 mEq/L (ref 137–147)
Total Bilirubin: 0.6 mg/dL (ref 0.3–1.2)
Total Protein: 5.8 g/dL — ABNORMAL LOW (ref 6.0–8.3)

## 2013-11-20 LAB — POCT I-STAT, CHEM 8
BUN: 21 mg/dL (ref 6–23)
Calcium, Ion: 1.11 mmol/L — ABNORMAL LOW (ref 1.13–1.30)
Chloride: 106 mEq/L (ref 96–112)
Creatinine, Ser: 1.3 mg/dL (ref 0.50–1.35)
Glucose, Bld: 103 mg/dL — ABNORMAL HIGH (ref 70–99)
HCT: 41 % (ref 39.0–52.0)
Hemoglobin: 13.9 g/dL (ref 13.0–17.0)
Potassium: 3.1 mEq/L — ABNORMAL LOW (ref 3.7–5.3)
Sodium: 145 mEq/L (ref 137–147)
TCO2: 26 mmol/L (ref 0–100)

## 2013-11-20 LAB — CBC
HCT: 41.5 % (ref 39.0–52.0)
Hemoglobin: 14.2 g/dL (ref 13.0–17.0)
MCH: 32.1 pg (ref 26.0–34.0)
MCHC: 34.2 g/dL (ref 30.0–36.0)
MCV: 93.7 fL (ref 78.0–100.0)
Platelets: 144 10*3/uL — ABNORMAL LOW (ref 150–400)
RBC: 4.43 MIL/uL (ref 4.22–5.81)
RDW: 14 % (ref 11.5–15.5)
WBC: 9.2 10*3/uL (ref 4.0–10.5)

## 2013-11-20 LAB — TROPONIN I: Troponin I: <0.3 ng/mL (ref <0.30)

## 2013-11-20 LAB — URINALYSIS, ROUTINE W REFLEX MICROSCOPIC
Bilirubin Urine: NEGATIVE
Glucose, UA: NEGATIVE mg/dL
Hgb urine dipstick: NEGATIVE
Ketones, ur: 15 mg/dL — AB
Leukocytes, UA: NEGATIVE
Nitrite: NEGATIVE
Protein, ur: NEGATIVE mg/dL
Specific Gravity, Urine: 1.011 (ref 1.005–1.030)
Urobilinogen, UA: 0.2 mg/dL (ref 0.0–1.0)
pH: 6.5 (ref 5.0–8.0)

## 2013-11-20 MED ORDER — ASPIRIN 81 MG PO TABS: 81.0000 mg | ORAL_TABLET | Freq: Every day | ORAL | Status: DC

## 2013-11-20 MED ORDER — LEVOTHYROXINE SODIUM 88 MCG PO TABS
88.0000 ug | ORAL_TABLET | Freq: Every day | ORAL | Status: DC
  Administered 2013-11-20 – 2013-11-22 (×3): 88 ug via ORAL
  Filled 2013-11-20 (×4): qty 1

## 2013-11-20 MED ORDER — ZOLPIDEM TARTRATE 5 MG PO TABS: 10.0000 mg | ORAL_TABLET | Freq: Every evening | ORAL | Status: DC | PRN

## 2013-11-20 MED ORDER — SIMVASTATIN 20 MG PO TABS
20.0000 mg | ORAL_TABLET | Freq: Every day | ORAL | Status: DC
Start: 2013-11-20 — End: 2013-11-22
  Administered 2013-11-20 – 2013-11-21 (×2): 20 mg via ORAL
  Filled 2013-11-20 (×3): qty 1

## 2013-11-20 MED ORDER — BROMOCRIPTINE MESYLATE 2.5 MG PO TABS
2.5000 mg | ORAL_TABLET | Freq: Every day | ORAL | Status: DC
  Administered 2013-11-20 – 2013-11-21 (×2): 2.5 mg via ORAL
  Filled 2013-11-20 (×3): qty 1

## 2013-11-20 MED ORDER — POTASSIUM CHLORIDE CRYS ER 20 MEQ PO TBCR
40.0000 meq | EXTENDED_RELEASE_TABLET | Freq: Once | ORAL | Status: AC
  Administered 2013-11-20: 40 meq via ORAL
  Filled 2013-11-20: qty 2

## 2013-11-20 MED ORDER — SODIUM CHLORIDE 0.9 % IJ SOLN
3.0000 mL | Freq: Two times a day (BID) | INTRAMUSCULAR | Status: DC
  Administered 2013-11-20 – 2013-11-22 (×5): 3 mL via INTRAVENOUS

## 2013-11-20 MED ORDER — HYDROCORTISONE 20 MG PO TABS
50.0000 mg | ORAL_TABLET | Freq: Two times a day (BID) | ORAL | Status: DC
  Administered 2013-11-20 – 2013-11-21 (×3): 50 mg via ORAL
  Filled 2013-11-20 (×6): qty 1

## 2013-11-20 MED ORDER — PREGABALIN 75 MG PO CAPS
75.0000 mg | ORAL_CAPSULE | Freq: Every day | ORAL | Status: DC
  Administered 2013-11-20 – 2013-11-21 (×2): 75 mg via ORAL
  Filled 2013-11-20 (×3): qty 1

## 2013-11-20 MED ORDER — ZOLPIDEM TARTRATE 5 MG PO TABS
10.0000 mg | ORAL_TABLET | Freq: Every day | ORAL | Status: DC
  Administered 2013-11-21 – 2013-11-22 (×2): 10 mg via ORAL
  Filled 2013-11-20 (×2): qty 2

## 2013-11-20 MED ORDER — ENOXAPARIN SODIUM 40 MG/0.4ML ~~LOC~~ SOLN
40.0000 mg | SUBCUTANEOUS | Status: DC
  Administered 2013-11-20 – 2013-11-22 (×3): 40 mg via SUBCUTANEOUS
  Filled 2013-11-20 (×3): qty 0.4

## 2013-11-20 MED ORDER — ASPIRIN EC 81 MG PO TBEC
81.0000 mg | DELAYED_RELEASE_TABLET | Freq: Every day | ORAL | Status: DC
  Administered 2013-11-20 – 2013-11-21 (×2): 81 mg via ORAL
  Filled 2013-11-20 (×3): qty 1

## 2013-11-20 MED ORDER — SODIUM CHLORIDE 0.9 % IV BOLUS (SEPSIS): 1000.0000 mL | Freq: Once | INTRAVENOUS | Status: DC

## 2013-11-20 MED ORDER — HYDROCORTISONE SOD SUCCINATE 100 MG IJ SOLR: 50.0000 mg | Freq: Once | INTRAMUSCULAR | Status: DC

## 2013-11-20 NOTE — ED Provider Notes (Signed)
CSN: [id number]     Arrival date & time 11/20/13  0145 History   First MD Initiated Contact with Patient 11/20/13 0149     Chief Complaint  Patient presents with  . Loss of Consciousness   (Consider location/radiation/quality/duration/timing/severity/associated sxs/prior Treatment) HPI History provided by patient. Syncopal episode. History of pituitary mass with panhypopituitarism.  Working about 17 hours today on his feet, feeling near syncopal and coworkers helped him to the ground. Patient carries injectable steroids with him in case of syncope. Coworkers administered steroid injection on scene and EMS was called. Patient was noted to be hypotensive in the 90s and IV fluids were administered. Patient has been awake and alert with EMS without unilateral deficits. He was noted to have slow speech. No seizure activity reported. On arrival to the emergency department patient denies any headache, unilateral weakness or numbness, difficulty with speech. He denies any chest pain or difficulty breathing. He admits to smoking marijuana earlier tonight.  Past Medical History  Diagnosis Date  . 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 Glucocorticoid deficiency   . Other testicular hypofunction   . Unspecified hypothyroidism   . Disturbances of sensation of smell and taste   . Abnormal involuntary movements(781.0)   . Encounter for long-term (current) use of other medications   . Panhypopituitarism   . Special screening for malignant neoplasms, colon   . Routine general medical examination at a health care facility   . Pure hypercholesterolemia   . Nephrolithiasis   . Asthma   . Osteopenia   . Craniopharyngioma     Dr. Carloyn Manner  . Sciatica   . Hypogonadism male   . Anosmia   . Tremor   . Panhypopituitarism   . [id number](478.2)     Frequent   Past Surgical History  Procedure Laterality Date  . Craniotomy      X 3 - Dr. Carloyn Manner  . Gamma knife  2007  . Left  middle finger mallet injury     Family History  Problem Relation Age of Onset  . Leukemia Other     aunt was adoptive mother  . Breast cancer Sister   . Cancer Sister     Breast CA in remission  . Cancer Other     Leukemia.  Aunt Adopted him  . Cancer Sister     Breast CA in remission   History  Substance Use Topics  . Smoking status: Never Smoker   . Smokeless tobacco: Not on file  . Alcohol Use: Yes     Comment: 1 drink a night    Review of Systems  Constitutional: Negative for fever and chills.  Eyes: Negative for visual disturbance.  Respiratory: Negative for shortness of breath.   Cardiovascular: Negative for chest pain.  Gastrointestinal: Negative for abdominal pain.  Genitourinary: Negative for dysuria.  Musculoskeletal: Negative for back pain, neck pain and neck stiffness.  Skin: Negative for rash.  Neurological: Positive for syncope. Negative for headaches.  All other systems reviewed and are negative.    Allergies  Review of patient's allergies indicates no known allergies.  Home Medications   Current Outpatient Rx  Name  Route  Sig  Dispense  Refill  . aspirin 81 MG tablet   Oral   Take 81 mg by mouth daily.         Marland Kitchen b complex vitamins tablet   Oral   Take 1 tablet by mouth daily.         Marland Kitchen  bromocriptine (PARLODEL) 2.5 MG tablet   Oral   Take 2.5 mg by mouth daily.          . calcium-vitamin D (OSCAL WITH D) 500-200 MG-UNIT per tablet   Oral   Take 1 tablet by mouth 3 (three) times daily.         Marland Kitchen dexamethasone (DECADRON) 1 MG/ML solution      Take 1 mL in emergency         . hydrocortisone (CORTEF) 10 MG tablet      Take 20 mg each morning and 10 mg each evening unless physiologically stressed, then double the dosages.         Marland Kitchen LYRICA 75 MG capsule   Oral   Take 75 mg by mouth daily.          . Multiple Vitamin (MULTIVITAMIN) tablet   Oral   Take 1 tablet by mouth daily.         . pravastatin (PRAVACHOL) 40 MG  tablet   Oral   Take 40 mg by mouth daily.         Marland Kitchen SYNTHROID 88 MCG tablet   Oral   Take 88 mcg by mouth daily.          Marland Kitchen testosterone cypionate (DEPOTESTOTERONE CYPIONATE) 100 MG/ML injection   Intramuscular   Inject 30 mg into the muscle. For IM use only Every 10 days         . zolpidem (AMBIEN) 10 MG tablet   Oral   Take 1 tablet (10 mg total) by mouth at bedtime as needed.   30 tablet   5    There were no vitals taken for this visit. Physical Exam  Constitutional: He is oriented to person, place, and time. He appears well-developed and well-nourished.  HENT:  Head: Normocephalic and atraumatic.  Eyes: EOM are normal. Pupils are equal, round, and reactive to light.  Neck: Neck supple.  Cardiovascular: Normal rate, regular rhythm and intact distal pulses.   Pulmonary/Chest: Effort normal and breath sounds normal. No respiratory distress. He exhibits no tenderness.  Abdominal: Soft. He exhibits no distension. There is no tenderness.  Musculoskeletal: Normal range of motion. He exhibits no edema.  Neurological: He is alert and oriented to person, place, and time. He displays normal reflexes. No cranial nerve deficit. He exhibits normal muscle tone. Coordination normal.  Speech is clear, no facial droop, no pronator drift  Skin: Skin is warm and dry.    ED Course  Procedures (including critical care time) Labs Review Labs Reviewed  CBC - Abnormal; Notable for the following:    Platelets 144 (*)    All other components within normal limits  COMPREHENSIVE METABOLIC PANEL - Abnormal; Notable for the following:    Potassium 3.4 (*)    Glucose, Bld 105 (*)    Calcium 8.3 (*)    Total Protein 5.8 (*)    GFR calc non Af Amer 65 (*)    GFR calc Af Amer 76 (*)    All other components within normal limits  POCT I-STAT, CHEM 8 - Abnormal; Notable for the following:    Potassium 3.1 (*)    Glucose, Bld 103 (*)    Calcium, Ion 1.11 (*)    All other components within  normal limits  TROPONIN I  URINALYSIS, ROUTINE W REFLEX MICROSCOPIC   Imaging Review Ct Head Wo Contrast  11/20/2013   CLINICAL DATA:  Syncope.  History of pituitary adenoma, surgery x3.  EXAM: CT HEAD WITHOUT CONTRAST  TECHNIQUE: Contiguous axial images were obtained from the base of the skull through the vertex without intravenous contrast.  COMPARISON:  MRI brain 05/09/2010.  CT head 06/20/2006.  FINDINGS: Postoperative changes with a right and of left frontotemporal craniotomies. Expansion of the sella turcica consistent with history of macroadenoma. Focal encephalomalacia in the right anterior temporal lobe and right posterior frontal lobe. No evidence of acute intracranial hemorrhage or mass effect. Ventricles are not dilated. No abnormal extra-axial fluid collections. Gray-white matter junctions are distinct. Basal cisterns are not effaced. No midline shift. Visualized paranasal sinuses and mastoid air cells are not opacified.  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 Old encephalomalacia in the right frontotemporal region. No evidence of acute intracranial hemorrhage or mass effect.   Electronically Signed   By: Lucienne Capers M.D.   On: 11/20/2013 02:58   Dg Chest Port 1 View  11/20/2013   CLINICAL DATA:  Syncope.  EXAM: PORTABLE CHEST - 1 VIEW  COMPARISON:  06/13/2006  FINDINGS: The heart size and mediastinal contours are within normal limits. Both lungs are clear. The visualized skeletal structures are unremarkable.  IMPRESSION: No active disease.   Electronically Signed   By: Lucienne Capers M.D.   On: 11/20/2013 02:21    EKG Interpretation    Date/Time:  Thursday November 20 2013 01:54:31 EST Ventricular Rate:  62 PR Interval:  159 QRS Duration: 100 QT Interval:  441 QTC Calculation: 448 R Axis:   71 Text Interpretation:  Sinus rhythm Minimal ST elevation, anterior leads No significant change since last tracing Confirmed by OPITZ  MD, BRIAN  [phone]) on 11/20/2013 2:16:57 AM           IV fluids provided. Normotensive in ED. Discussed with hospitalist on-call, plan admit  MDM  Diagnosis: Syncope  Evaluated with EKG, chest x-ray, CT brain, labs all reviewed as above IV fluids and potassium provided MED admit  Teressa Lower, MD 11/20/13 [phone]

## 2013-11-20 NOTE — H&P (Signed)
[facility] History and Physical  Patient: Brian Stokes  QMG:[id number]  DOB: 07/07/1951  DOS: the patient was seen and examined on 11/20/2013 PCP: Elsie Stain, MD  Chief Complaint: Syncope  HPI: Brian Stokes is a 63 y.o. 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 The patient is coming from home. 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 Since last few days he has nasal congestion and has been taking 60 mg of Cortef(he generally takes 30 mg of Cortef 20 mg in the morning and 10 mg in the evening) 40 mg in the morning and 20 mg in the evening. This morning he reduced the dose to 30 mg since he was feeling better. At the end of the day he was drinking wine with his coworker and had an episode of shaking and becoming less responsive. The coworker tried to wake him up and he was awake. But then he had another episode of nearly passing out and more shaking. At which time the coworker gave him a shot of IM Decadron and called EMS. EMS found that his blood pressure was in 90s and he was minimally responsive and drowsy. Begin fluids and brought him to the ED. Pt denies any fever, chills, headache, cough, chest pain, palpitation, shortness of breath, orthopnea, PND, nausea, vomiting, abdominal pain, diarrhea, constipation, active bleeding, burning urination, dizziness, pedal edema,  focal neurological deficit.   Review of Systems: as mentioned in the history of present illness.  A Comprehensive review of the other systems is negative.  Past Medical History  Diagnosis Date  . 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 Glucocorticoid deficiency   . Other testicular hypofunction   . Unspecified hypothyroidism   . Disturbances of sensation of smell and taste   . Abnormal involuntary movements(781.0)   .  Encounter for long-term (current) use of other medications   . Panhypopituitarism   . Special screening for malignant neoplasms, colon   . Routine general medical examination at a health care facility   . Pure hypercholesterolemia   . Nephrolithiasis   . Asthma   . Osteopenia   . Craniopharyngioma     Dr. Carloyn Manner  . Sciatica   . Hypogonadism male   . Anosmia   . Tremor   . Panhypopituitarism   . TOIZTIWP(809.9)     Frequent   Past Surgical History  Procedure Laterality Date  . Craniotomy      X 3 - Dr. Carloyn Manner  . Gamma knife  2007  . Left middle finger mallet injury     Social History:  reports that he has never smoked. He does not have any smokeless tobacco history on file. He reports that he drinks alcohol. He reports that he does not use illicit drugs. Independent for most of his  ADL.  No Known Allergies  Family History  Problem Relation Age of Onset  . Leukemia Other     aunt was adoptive mother  . Breast cancer Sister   . Cancer Sister     Breast CA in remission  . Cancer Other     Leukemia.  Aunt Adopted him  . Cancer Sister     Breast CA in remission    Prior to Admission medications   Medication Sig Start Date End Date Taking? Authorizing Provider  aspirin 81 MG tablet Take 81  mg by mouth daily.   Yes Historical Provider, MD  b complex vitamins tablet Take 1 tablet by mouth daily.   Yes Historical Provider, MD  bromocriptine (PARLODEL) 2.5 MG tablet Take 2.5 mg by mouth daily.  01/31/11  Yes Historical Provider, MD  calcium-vitamin D (OSCAL WITH D) 500-200 MG-UNIT per tablet Take 1 tablet by mouth 3 (three) times daily.   Yes Historical Provider, MD  dexamethasone (DECADRON) 1 MG/ML solution Take 1 mL in emergency   Yes Historical Provider, MD  hydrocortisone (CORTEF) 10 MG tablet Take 20 mg each morning and 10 mg each evening unless physiologically stressed, then double the dosages. 01/30/11  Yes Historical Provider, MD  LYRICA 75 MG capsule Take 75 mg by mouth  daily.  01/22/11  Yes Historical Provider, MD  Multiple Vitamin (MULTIVITAMIN) tablet Take 1 tablet by mouth daily.   Yes Historical Provider, MD  pravastatin (PRAVACHOL) 40 MG tablet Take 40 mg by mouth daily.   Yes Historical Provider, MD  SYNTHROID 88 MCG tablet Take 88 mcg by mouth daily.  01/11/11  Yes Historical Provider, MD  testosterone cypionate (DEPOTESTOTERONE CYPIONATE) 100 MG/ML injection Inject 30 mg into the muscle. For IM use only Every 10 days   Yes Historical Provider, MD  zolpidem (AMBIEN) 10 MG tablet Take 1 tablet (10 mg total) by mouth at bedtime as needed. 10/28/13  Yes Tonia Ghent, MD    Physical Exam: Filed Vitals:   11/20/13 0215 11/20/13 0345 11/20/13 0415 11/20/13 0445  BP: 109/61 121/72 125/74 125/71  Pulse: 62 69 62 58  Resp: 14 18 18 17   SpO2: 97% 99% 99% 98%    General: Alert, Awake and Oriented to Time, Place and Person. Appear in mild distress Eyes: PERRL ENT: Oral Mucosa clear moist. Neck:  No JVD Cardiovascular: S1 and S2 Present,  no Murmur, Peripheral Pulses Present Respiratory: Bilateral Air entry equal and Decreased, Clear to Auscultation,  no Crackles, no wheezes Abdomen: Bowel Sound Present, Soft and Non tender Skin:  No Rash Extremities:  No Pedal edema,  no calf tenderness Neurologic: Grossly Unremarkable.  Labs on Admission:  CBC:  Recent Labs Lab 11/20/13 0151 11/20/13 0218  WBC 9.2  --   HGB 14.2 13.9  HCT 41.5 41.0  MCV 93.7  --   PLT 144*  --     CMP     Component Value Date/Time   NA 145 11/20/2013 0218   K 3.1* 11/20/2013 0218   CL 106 11/20/2013 0218   CO2 25 11/20/2013 0151   GLUCOSE 103* 11/20/2013 0218   BUN 21 11/20/2013 0218   CREATININE 1.30 11/20/2013 0218   CALCIUM 8.3* 11/20/2013 0151   PROT 5.8* 11/20/2013 0151   ALBUMIN 3.6 11/20/2013 0151   AST 30 11/20/2013 0151   ALT 29 11/20/2013 0151   ALKPHOS 41 11/20/2013 0151   BILITOT 0.6 11/20/2013 0151   GFRNONAA 65* 11/20/2013 0151   GFRAA 76* 11/20/2013 0151    No results  found for this basename: LIPASE, AMYLASE,  in the last 168 hours No results found for this basename: AMMONIA,  in the last 168 hours   Recent Labs Lab 11/20/13 0151  TROPONINI <0.30   BNP (last 3 results) No results found for this basename: PROBNP,  in the last 8760 hours  Radiological Exams on Admission: Ct Head Wo Contrast  11/20/2013   CLINICAL DATA:  Syncope.  History of pituitary adenoma, surgery x3.  EXAM: CT HEAD WITHOUT CONTRAST  TECHNIQUE: Contiguous  axial images were obtained from the base of the skull through the vertex without intravenous contrast.  COMPARISON:  MRI brain 05/09/2010.  CT head 06/20/2006.  FINDINGS: Postoperative changes with a right and of left frontotemporal craniotomies. Expansion of the sella turcica consistent with history of macroadenoma. Focal encephalomalacia in the right anterior temporal lobe and right posterior frontal lobe. No evidence of acute intracranial hemorrhage or mass effect. Ventricles are not dilated. No abnormal extra-axial fluid collections. Gray-white matter junctions are distinct. Basal cisterns are not effaced. No midline shift. Visualized paranasal sinuses and mastoid air cells are not opacified.  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 Old encephalomalacia in the right frontotemporal region. No evidence of acute intracranial hemorrhage or mass effect.   Electronically Signed   By: Lucienne Capers M.D.   On: 11/20/2013 02:58   Dg Chest Port 1 View  11/20/2013   CLINICAL DATA:  Syncope.  EXAM: PORTABLE CHEST - 1 VIEW  COMPARISON:  06/13/2006  FINDINGS: The heart size and mediastinal contours are within normal limits. Both lungs are clear. The visualized skeletal structures are unremarkable.  IMPRESSION: No active disease.   Electronically Signed   By: Lucienne Capers M.D.   On: 11/20/2013 02:21    EKG: Independently reviewed. normal sinus rhythm, nonspecific ST and T waves  changes.  Assessment/Plan Principal Problem:   Syncope Active Problems:   CRANIOPHARYNGIOMA   HYPOTHYROIDISM   Panhypopituitarism   Glucocorticoid deficiency   HYPOGONADISM   Insomnia   1. Syncope  the patient is presenting with episode of syncope with hypotension. Possible etiology in his case include vasovagal syncope versus severe hypotension secondary to adrenal insufficiency which has responded to IV Decadron and fluids. At present he appears stable and his hemodynamic parameters are within normal limits. He continues to feel lethargic. His CT scan of the head does not show any acute abnormality as well as EKG is within normal limits. He will be admitted for observation. He will be given extra dose of IV Cortef 50 mg. I will increase his Cortef to 50 mg twice a day for few days and then taper gradually.  2. Panhypopituitarism I would continue his Synthroid and bromocriptine  3. Insomnia Continue Ambien  DVT Prophylaxis: subcutaneous Heparin Nutrition:  Regular diet  Code Status:  Full  Disposition: Admitted to observation in telemetry unit.  Author: Berle Mull, MD [facility] Pager: [phone] 11/20/2013, 6:07 AM    If 7PM-7AM, please contact night-coverage [email or web address] Password [email or web address]

## 2013-11-20 NOTE — ED Notes (Signed)
Per EMS: pt was working at his job [employer]) for [employer] today. Pt had a syncopal episode where he was then given 1mg  of dexamethasone (due to having a pituatary glad tumor and being steroid depleted.) pt initial pressure was 22'G systolic with EMS, they started a line and gave him 225mL's of NS and trendelenberged patient to help with pressure.

## 2013-11-20 NOTE — Progress Notes (Signed)
[facility] PROGRESS NOTE  Brian Stokes OZD:[id number] DOB: May 24, 1951 DOA: 11/20/2013 PCP: Elsie Stain, MD  Patient with panhypopituitarism and insomnia experienced a syncopal episode after an extremely busy evening (he is a Scientist, clinical (histocompatibility and immunogenetics)) and no sleep for 30 hours.  He has bradycardia but states this is not unusual for him.   Assessment/Plan:  Syncope Likely due to hypotension secondary to adrenal insufficiency.   Patient has responded to Steroids and IVF.  Hydro-cortef has been temporarily increased.  Would recommend taking increased dose until he see's Dr. Carloyn Manner again post disharge. (40 mg am and 20 mg pm) CT scan is negative Checking TSH and 2D echo. Patient much improved, but still exhausted.  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 Follow up with Dr. Carloyn Manner post discharge  Bradycardia Patient states this is not unusual for him. Will check TSH / Free T4. Patient follows with Dr. Chalmers Cater for endocrine.  Insomnia Patient reports no sleep for over 30 hours. Continue ambien 10 mg qhs.    DVT Prophylaxis:  lovenox  Code Status: full Family Communication: patient alert and orientated. Disposition Plan: to home likely 11/21/13.    2D echo pending.  HPI/Subjective: Patient describes being extremely busy recently due to New Years.  Was on an elevated dose of cortef but had lowered it yesterday.  Objective: Filed Vitals:   11/20/13 1440 11/20/13 1441 11/20/13 1442 11/20/13 1447  BP: 130/80 141/85 137/82 137/84  Pulse: 66 70 74 76  Temp: 97.8 F (36.6 C)     TempSrc: Oral     Resp: 18     Height:      Weight:      SpO2: 97%       Intake/Output Summary (Last 24 hours) at 11/20/13 1447 Last data filed at 11/20/13 1000  Gross per 24 hour  Intake      3 ml  Output      0 ml  Net      3 ml   Filed Weights   11/20/13 0656  Weight: 76.613 kg (168 lb 14.4 oz)    Exam: General: Well  developed, well nourished, NAD, appears stated age.  Slightly tremulous. HEENT:  PERR, EOMI, Anicteic Sclera, MMM. No pharyngeal erythema or exudates  Neck: Supple, no JVD, no masses  Cardiovascular: RRR, S1 S2 auscultated, no rubs, murmurs or gallops.   Respiratory: Clear to auscultation bilaterally with equal chest rise  Abdomen: Soft, nontender, nondistended, + bowel sounds  Extremities: warm dry without cyanosis clubbing or edema. - bruised 3rd finger with full rom. Neuro: AAOx3, cranial nerves grossly intact. Strength 5/5 in upper and lower extremities  Skin: Without rashes exudates or nodules.   Psych: Normal affect and demeanor with intact judgement and insight       Data Reviewed: Basic Metabolic Panel:  Recent Labs Lab 11/20/13 0151 11/20/13 0218  NA 147 145  K 3.4* 3.1*  CL 107 106  CO2 25  --   GLUCOSE 105* 103*  BUN 22 21  CREATININE 1.18 1.30  CALCIUM 8.3*  --    Liver Function Tests:  Recent Labs Lab 11/20/13 0151  AST 30  ALT 29  ALKPHOS 41  BILITOT 0.6  PROT 5.8*  ALBUMIN 3.6   CBC:  Recent Labs Lab 11/20/13 0151 11/20/13 0218  WBC 9.2  --   HGB 14.2 13.9  HCT 41.5 41.0  MCV 93.7  --   PLT 144*  --    Cardiac Enzymes:  Recent Labs Lab 11/20/13 0151  TROPONINI <0.30    Studies: Ct Head Wo Contrast  11/20/2013   CLINICAL DATA:  Syncope.  History of pituitary adenoma, surgery x3.  EXAM: CT HEAD WITHOUT CONTRAST  TECHNIQUE: Contiguous axial images were obtained from the base of the skull through the vertex without intravenous contrast.  COMPARISON:  MRI brain 05/09/2010.  CT head 06/20/2006.  FINDINGS: Postoperative changes with a right and of left frontotemporal craniotomies. Expansion of the sella turcica consistent with history of macroadenoma. Focal encephalomalacia in the right anterior temporal lobe and right posterior frontal lobe. No evidence of acute intracranial hemorrhage or mass effect. Ventricles are not dilated. No abnormal  extra-axial fluid collections. Gray-white matter junctions are distinct. Basal cisterns are not effaced. No midline shift. Visualized paranasal sinuses and mastoid air cells are not opacified.  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 Old encephalomalacia in the right frontotemporal region. No evidence of acute intracranial hemorrhage or mass effect.   Electronically Signed   By: Lucienne Capers M.D.   On: 11/20/2013 02:58   Dg Chest Port 1 View  11/20/2013   CLINICAL DATA:  Syncope.  EXAM: PORTABLE CHEST - 1 VIEW  COMPARISON:  06/13/2006  FINDINGS: The heart size and mediastinal contours are within normal limits. Both lungs are clear. The visualized skeletal structures are unremarkable.  IMPRESSION: No active disease.   Electronically Signed   By: Lucienne Capers M.D.   On: 11/20/2013 02:21    Scheduled Meds: . aspirin EC  81 mg Oral Daily  . bromocriptine  2.5 mg Oral Daily  . enoxaparin (LOVENOX) injection  40 mg Subcutaneous Q24H  . hydrocortisone  50 mg Oral BID  . hydrocortisone sod succinate (SOLU-CORTEF) inj  50 mg Intravenous Once  . levothyroxine  88 mcg Oral QAC breakfast  . potassium chloride  40 mEq Oral Once  . pregabalin  75 mg Oral Daily  . simvastatin  20 mg Oral q1800  . sodium chloride  1,000 mL Intravenous Once  . sodium chloride  3 mL Intravenous Q12H  . zolpidem  10 mg Oral QHS   Continuous Infusions:   Principal Problem:   Syncope Active Problems:   CRANIOPHARYNGIOMA   HYPOTHYROIDISM   Panhypopituitarism   Glucocorticoid deficiency   HYPOGONADISM   Insomnia    Karen Kitchens  [facility] Pager [phone]. If 7PM-7AM, please contact night-coverage at [email or web address], password [facility] 11/20/2013, 2:47 PM  LOS: 0 days

## 2013-11-21 DIAGNOSIS — I959 Hypotension, unspecified: Secondary | ICD-10-CM

## 2013-11-21 DIAGNOSIS — I517 Cardiomegaly: Secondary | ICD-10-CM

## 2013-11-21 DIAGNOSIS — E039 Hypothyroidism, unspecified: Secondary | ICD-10-CM

## 2013-11-21 LAB — BASIC METABOLIC PANEL
BUN: 16 mg/dL (ref 6–23)
CO2: 27 mEq/L (ref 19–32)
Calcium: 8.4 mg/dL (ref 8.4–10.5)
Chloride: 109 mEq/L (ref 96–112)
Creatinine, Ser: 0.83 mg/dL (ref 0.50–1.35)
GFR calc Af Amer: >90 mL/min (ref >90)
GFR calc non Af Amer: >90 mL/min (ref >90)
Glucose, Bld: 100 mg/dL — ABNORMAL HIGH (ref 70–99)
Potassium: 4.1 mEq/L (ref 3.7–5.3)
Sodium: 146 mEq/L (ref 137–147)

## 2013-11-21 LAB — T4, FREE: Free T4: 1.28 ng/dL (ref 0.80–1.80)

## 2013-11-21 LAB — CBC
HCT: 42.1 % (ref 39.0–52.0)
Hemoglobin: 14.5 g/dL (ref 13.0–17.0)
MCH: 32.2 pg (ref 26.0–34.0)
MCHC: 34.4 g/dL (ref 30.0–36.0)
MCV: 93.3 fL (ref 78.0–100.0)
Platelets: 153 10*3/uL (ref 150–400)
RBC: 4.51 MIL/uL (ref 4.22–5.81)
RDW: 14.3 % (ref 11.5–15.5)
WBC: 11.7 10*3/uL — ABNORMAL HIGH (ref 4.0–10.5)

## 2013-11-21 LAB — TSH: TSH: 0.095 u[IU]/mL — ABNORMAL LOW (ref 0.350–4.500)

## 2013-11-21 LAB — MAGNESIUM: Magnesium: 2.4 mg/dL (ref 1.5–2.5)

## 2013-11-21 MED ORDER — HYDROCORTISONE 20 MG PO TABS
40.0000 mg | ORAL_TABLET | Freq: Two times a day (BID) | ORAL | Status: DC
  Administered 2013-11-21 – 2013-11-22 (×2): 40 mg via ORAL
  Filled 2013-11-21 (×3): qty 2

## 2013-11-21 NOTE — Progress Notes (Signed)
Utilization review completed.  

## 2013-11-21 NOTE — Progress Notes (Signed)
  Echocardiogram 2D Echocardiogram has been performed.  Brian Stokes 11/21/2013, 1:16 PM

## 2013-11-21 NOTE — Progress Notes (Signed)
[facility] PROGRESS NOTE  Brian Stokes FIE:[id number] DOB: Jun 29, 1951 DOA: 11/20/2013 PCP: Elsie Stain, MD  Assessment/Plan: #1syncope Likely secondary to hypotension secondary to volume depletion and probable adrenal insufficiency. Patient with no further syncopal episodes. Patient's Cortef was increased to 50 mg twice daily. CT of the head is negative. TSH is decreased however free T4 was within normal limits.2-D echo with EF of 50-55% with no wall motion abnormalities. No valvular abnormalities.we'll decrease Cortef to 40 mg twice daily. Follow.  #2 hypotension Secondary to volume depletion and probable adrenal insufficiency. Resolved with fluids and Cortef.  #3 history of Panhypopituitism secondary to craniopharyngioma status post craniotomy Stable. CT head negative for any acute abnormalities. Continue Synthroid, Cortef, bromocriptine. Followup with Dr. Carloyn Manner and endocrinologist as outpatient.  #4 bradycardia TSH is decreased however free T4 was within normal limits. Patient currently asymptomatic. Outpatient followup.  #5 insomnia Improved on Ambien.  #6 prophylaxis Lovenox for DVT prophylaxis.  Code Status: full Family Communication: updated patient no family at bedside. Disposition Plan: home when medically stable hopefully tomorrow.   Consultants:  none  Procedures:  CT head 11/20/2013  2-D echo on 2 2015  Chest x-ray 11/20/2013  Antibiotics:  none  HPI/Subjective: Patient with no complaints. Patient states he's feeling better.  Objective: Filed Vitals:   11/21/13 1402  BP: 115/81  Pulse: 57  Temp: 98.2 F (36.8 C)  Resp: 16   No intake or output data in the 24 hours ending 11/21/13 1516 Filed Weights   11/20/13 0656  Weight: 76.613 kg (168 lb 14.4 oz)    Exam:   General:  NAD  Cardiovascular: RRR  Respiratory: CTAB  Abdomen: Soft/NT/ND/+BS  Musculoskeletal: nO C/C/E  Data Reviewed: Basic Metabolic Panel:  Recent Labs Lab  11/20/13 0151 11/20/13 0218 11/21/13 0820  NA 147 145 146  K 3.4* 3.1* 4.1  CL 107 106 109  CO2 25  --  27  GLUCOSE 105* 103* 100*  BUN 22 21 16   CREATININE 1.18 1.30 0.83  CALCIUM 8.3*  --  8.4  MG  --   --  2.4   Liver Function Tests:  Recent Labs Lab 11/20/13 0151  AST 30  ALT 29  ALKPHOS 41  BILITOT 0.6  PROT 5.8*  ALBUMIN 3.6   No results found for this basename: LIPASE, AMYLASE,  in the last 168 hours No results found for this basename: AMMONIA,  in the last 168 hours CBC:  Recent Labs Lab 11/20/13 0151 11/20/13 0218 11/21/13 0820  WBC 9.2  --  11.7*  HGB 14.2 13.9 14.5  HCT 41.5 41.0 42.1  MCV 93.7  --  93.3  PLT 144*  --  153   Cardiac Enzymes:  Recent Labs Lab 11/20/13 0151  TROPONINI <0.30   BNP (last 3 results) No results found for this basename: PROBNP,  in the last 8760 hours CBG: No results found for this basename: GLUCAP,  in the last 168 hours  No results found for this or any previous visit (from the past 240 hour(s)).   Studies: Ct Head Wo Contrast  11/20/2013   CLINICAL DATA:  Syncope.  History of pituitary adenoma, surgery x3.  EXAM: CT HEAD WITHOUT CONTRAST  TECHNIQUE: Contiguous axial images were obtained from the base of the skull through the vertex without intravenous contrast.  COMPARISON:  MRI brain 05/09/2010.  CT head 06/20/2006.  FINDINGS: Postoperative changes with a right and of left frontotemporal craniotomies. Expansion of the sella turcica consistent with history of  macroadenoma. Focal encephalomalacia in the right anterior temporal lobe and right posterior frontal lobe. No evidence of acute intracranial hemorrhage or mass effect. Ventricles are not dilated. No abnormal extra-axial fluid collections. Gray-white matter junctions are distinct. Basal cisterns are not effaced. No midline shift. Visualized paranasal sinuses and mastoid air cells are not opacified.  IMPRESSION: Changes of pituitary macroadenoma with postoperative  change present. Old encephalomalacia in the right frontotemporal region. No evidence of acute intracranial hemorrhage or mass effect.   Electronically Signed   By: Lucienne Capers M.D.   On: 11/20/2013 02:58   Dg Chest Port 1 View  11/20/2013   CLINICAL DATA:  Syncope.  EXAM: PORTABLE CHEST - 1 VIEW  COMPARISON:  06/13/2006  FINDINGS: The heart size and mediastinal contours are within normal limits. Both lungs are clear. The visualized skeletal structures are unremarkable.  IMPRESSION: No active disease.   Electronically Signed   By: Lucienne Capers M.D.   On: 11/20/2013 02:21    Scheduled Meds: . aspirin EC  81 mg Oral Daily  . bromocriptine  2.5 mg Oral Daily  . enoxaparin (LOVENOX) injection  40 mg Subcutaneous Q24H  . hydrocortisone  50 mg Oral BID  . hydrocortisone sod succinate (SOLU-CORTEF) inj  50 mg Intravenous Once  . levothyroxine  88 mcg Oral QAC breakfast  . pregabalin  75 mg Oral Daily  . simvastatin  20 mg Oral q1800  . sodium chloride  1,000 mL Intravenous Once  . sodium chloride  3 mL Intravenous Q12H  . zolpidem  10 mg Oral QHS   Continuous Infusions:   Principal Problem:   Syncope Active Problems:   CRANIOPHARYNGIOMA   HYPOTHYROIDISM   Panhypopituitarism   Glucocorticoid deficiency   HYPOGONADISM   Insomnia    Time spent: 35 mins    [facility] MD [facility] Pager [phone]. If 7PM-7AM, please contact night-coverage at [email or web address], password [facility] 11/21/2013, 3:16 PM  LOS: 1 day

## 2013-11-22 MED ORDER — HYDROCORTISONE 10 MG PO TABS: 40.0000 mg | ORAL_TABLET | Freq: Two times a day (BID) | ORAL | Status: DC

## 2013-11-22 NOTE — Progress Notes (Signed)
Utilization Review completed.  

## 2013-11-22 NOTE — Discharge Summary (Signed)
Physician Discharge Summary  Brian Stokes [id number] DOB: 01/25/1951 DOA: 11/20/2013  PCP: Elsie Stain, MD  Admit date: 11/20/2013 Discharge date: 11/22/2013  Time spent: 65 minutes  Recommendations for Outpatient Follow-up:  1. Followup with Dr. Carloyn Manner of neurosurgery in 1 week for further management of his panhypopituitarism and titration of Cortef. 2. Followup with Dr. Debbora Presto of endocrinology in 1 week for further management of panhypopituitarism and management of Cortef as patient was discharged on 40 mg twice daily.  Discharge Diagnoses:  Principal Problem:   Syncope Active Problems:   CRANIOPHARYNGIOMA   HYPOTHYROIDISM   Panhypopituitarism   Glucocorticoid deficiency   HYPOGONADISM   Insomnia   Discharge Condition: Stable and improved  Diet recommendation: Regular  Filed Weights   11/20/13 0656  Weight: 76.613 kg (168 lb 14.4 oz)    History of present illness:  Brian Stokes is a 63 y.o. 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  The patient is coming from home.  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 Since last few days he has nasal congestion and has been taking 60 mg of Cortef(he generally takes 30 mg of Cortef 20 mg in the morning and 10 mg in the evening) 40 mg in the morning and 20 mg in the evening. This morning he reduced the dose to 30 mg since he was feeling better.  At the end of the day he was drinking wine with his coworker and had an episode of shaking and becoming less responsive. The coworker tried to wake him up and he was awake. But then he had another episode of nearly passing out and more shaking. At which time the coworker gave him a shot of IM Decadron and called EMS. EMS found that his blood pressure was in 90s and he was minimally responsive and drowsy. Begin fluids and brought him to the ED.  Pt  denies any fever, chills, headache, cough, chest pain, palpitation, shortness of breath, orthopnea, PND, nausea, vomiting, abdominal pain, diarrhea, constipation, active bleeding, burning urination, dizziness, pedal edema, focal neurological deficit.      Hospital Course:  #1syncope  Likely secondary to hypotension secondary to volume depletion and probable adrenal insufficiency. Patient was admitted placed on IV fluids and initially placed on stress dose steroids. Patient improved clinically. CT of the head which was done was negative. TSH was done which came back at 0.095 and a free T4 within normal limits at 1.28. 2-D echo was obtained which had a EF of 50-55% with no wall motion abnormalities. Patient was subsequently transitioned from IV steroids to oral Cortef 50 mg twice daily. Patient did not have any further syncopal episodes. Patient Cortef was further decreased to 40 mg twice daily which should be discharged home on. Patient will followup with Dr. Carloyn Manner of neurosurgery and Dr. Debbora Presto of endocrinology as outpatient. Patient was discharged in stable and improved condition. #2 hypotension  Secondary to volume depletion and probable adrenal insufficiency. Patient had no signs or symptoms of infection. Troponin which was done was negative. 2-D echocardiogram of 50-55% with no wall motion abnormalities. Resolved with fluids and Cortef. Outpatient followup. #3 history of Panhypopituitism secondary to craniopharyngioma status post craniotomy  Stable. CT head negative for any acute abnormalities. Patient on admission was placed on IV steroids secondary to problems #1 and 2 which was subsequently tapered down to oral Cortef. Patient  was continued on his Synthroid and bromocriptine. Followup with Dr. Carloyn Manner and endocrinologist as outpatient.  #4 bradycardia  TSH is decreased however free T4 was within normal limits. Patient currently asymptomatic. 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 Outpatient followup.   #5 insomnia  Improved on Ambien.   Procedures:  CT head 11/20/2013  Chest x-ray 11/20/2013  2-D echo 1/ 2/ 2015  Consultations:  None  Discharge Exam: Filed Vitals:   11/22/13 0447  BP: 96/63  Pulse: 50  Temp: 97.7 F (36.5 C)  Resp: 16    General: NAD Cardiovascular: RRR Respiratory: CTAB  Discharge Instructions      Discharge Orders   Future Orders Complete By Expires   Diet general  As directed    Discharge instructions  As directed    Comments:     Follow up with Dr Carloyn Manner in 1 week. Follow up with Dr Chalmers Cater in 1 week.   Increase activity slowly  As directed        Medication List         aspirin 81 MG tablet  Take 81 mg by mouth daily.     b complex vitamins tablet  Take 1 tablet by mouth daily.     bromocriptine 2.5 MG tablet  Commonly known as:  PARLODEL  Take 2.5 mg by mouth daily.     calcium-vitamin D 500-200 MG-UNIT per tablet  Commonly known as:  OSCAL WITH D  Take 1 tablet by mouth 3 (three) times daily.     dexamethasone 1 MG/ML solution  Commonly known as:  DECADRON  Take 1 mL in emergency     hydrocortisone 10 MG tablet  Commonly known as:  CORTEF  Take 4 tablets (40 mg total) by mouth 2 (two) times daily. Take until seen by endocrinologist or Dr Carloyn Manner next week and then as per endocrinologist or Dr Carloyn Manner.     LYRICA 75 MG capsule  Generic drug:  pregabalin  Take 75 mg by mouth daily.     multivitamin tablet  Take 1 tablet by mouth daily.     pravastatin 40 MG tablet  Commonly known as:  PRAVACHOL  Take 40 mg by mouth daily.     SYNTHROID 88 MCG tablet  Generic drug:  levothyroxine  Take 88 mcg by mouth daily.     testosterone cypionate 100 MG/ML injection  Commonly known as:  DEPOTESTOTERONE CYPIONATE  - Inject 30 mg into the muscle. For IM use only  - Every 10 days     zolpidem 10 MG tablet  Commonly known as:  AMBIEN  Take 1 tablet (10 mg total) by mouth at bedtime as needed.       No Known  Allergies Follow-up Information   Follow up with ROY, MARK, MD. Schedule an appointment as soon as possible for a visit in 1 week.   Specialty:  Neurosurgery   Contact information:   [address] [place] [address] [phone]       Follow up with Jacelyn Pi, MD. Schedule an appointment as soon as possible for a visit in 1 week.   Specialty:  Endocrinology   Contact information:   [address] [place] [address] [phone]        The results of significant diagnostics from this hospitalization (including imaging, microbiology, ancillary and laboratory) are listed below for reference.    Significant Diagnostic Studies: Ct Head Wo Contrast  11/20/2013   CLINICAL DATA:  Syncope.  History of pituitary adenoma, surgery x3.  EXAM: CT HEAD WITHOUT CONTRAST  TECHNIQUE: Contiguous axial images were obtained from the base of the skull through the vertex without intravenous contrast.  COMPARISON:  MRI brain 05/09/2010.  CT head 06/20/2006.  FINDINGS: Postoperative changes with a right and of left frontotemporal craniotomies. Expansion of the sella turcica consistent with history of macroadenoma. Focal encephalomalacia in the right anterior temporal lobe and right posterior frontal lobe. No evidence of acute intracranial hemorrhage or mass effect. Ventricles are not dilated. No abnormal extra-axial fluid collections. Gray-white matter junctions are distinct. Basal cisterns are not effaced. No midline shift. Visualized paranasal sinuses and mastoid air cells are not opacified.  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 Old encephalomalacia in the right frontotemporal region. No evidence of acute intracranial hemorrhage or mass effect.   Electronically Signed   By: Lucienne Capers M.D.   On: 11/20/2013 02:58   Dg Chest Port 1 View  11/20/2013   CLINICAL DATA:  Syncope.  EXAM: PORTABLE CHEST - 1 VIEW  COMPARISON:  06/13/2006   FINDINGS: The heart size and mediastinal contours are within normal limits. Both lungs are clear. The visualized skeletal structures are unremarkable.  IMPRESSION: No active disease.   Electronically Signed   By: Lucienne Capers M.D.   On: 11/20/2013 02:21    Microbiology: No results found for this or any previous visit (from the past 240 hour(s)).   Labs: Basic Metabolic Panel:  Recent Labs Lab 11/20/13 0151 11/20/13 0218 11/21/13 0820  NA 147 145 146  K 3.4* 3.1* 4.1  CL 107 106 109  CO2 25  --  27  GLUCOSE 105* 103* 100*  BUN 22 21 16   CREATININE 1.18 1.30 0.83  CALCIUM 8.3*  --  8.4  MG  --   --  2.4   Liver Function Tests:  Recent Labs Lab 11/20/13 0151  AST 30  ALT 29  ALKPHOS 41  BILITOT 0.6  PROT 5.8*  ALBUMIN 3.6   No results found for this basename: LIPASE, AMYLASE,  in the last 168 hours No results found for this basename: AMMONIA,  in the last 168 hours CBC:  Recent Labs Lab 11/20/13 0151 11/20/13 0218 11/21/13 0820  WBC 9.2  --  11.7*  HGB 14.2 13.9 14.5  HCT 41.5 41.0 42.1  MCV 93.7  --  93.3  PLT 144*  --  153   Cardiac Enzymes:  Recent Labs Lab 11/20/13 0151  TROPONINI <0.30   BNP: BNP (last 3 results) No results found for this basename: PROBNP,  in the last 8760 hours CBG: No results found for this basename: GLUCAP,  in the last 168 hours     Signed:  [facility] MD [facility] 11/22/2013, 9:59 AM

## 2014-04-14 ENCOUNTER — Telehealth: Payer: Self-pay

## 2014-04-14 NOTE — Telephone Encounter (Signed)
Pt is applying for social security disability and request records sent to social security on line; pt will come to office to sign record release.

## 2014-04-16 ENCOUNTER — Encounter: Payer: Self-pay | Admitting: Family Medicine

## 2014-04-16 ENCOUNTER — Ambulatory Visit (INDEPENDENT_AMBULATORY_CARE_PROVIDER_SITE_OTHER): Payer: BC Managed Care – PPO | Admitting: Family Medicine

## 2014-04-16 VITALS — BP 102/60 | HR 66 | Temp 98.0°F | Wt 173.0 lb

## 2014-04-16 DIAGNOSIS — E230 Hypopituitarism: Secondary | ICD-10-CM

## 2014-04-16 LAB — BASIC METABOLIC PANEL
BUN: 13 mg/dL (ref 6–23)
CO2: 26 mEq/L (ref 19–32)
Calcium: 8.8 mg/dL (ref 8.4–10.5)
Chloride: 106 mEq/L (ref 96–112)
Creatinine, Ser: 0.9 mg/dL (ref 0.4–1.5)
GFR: 91.09 mL/min (ref >60.00)
Glucose, Bld: 89 mg/dL (ref 70–99)
Potassium: 4 mEq/L (ref 3.5–5.1)
Sodium: 139 mEq/L (ref 135–145)

## 2014-04-16 NOTE — Patient Instructions (Signed)
Don't change your meds for now.  We'll contact you with your lab report.

## 2014-04-16 NOTE — Progress Notes (Signed)
Pre visit review using our clinic review tool, if applicable. No additional management support is needed unless otherwise documented below in the visit note.  He was under a lot of stress recently, has been working on his disability forms.  It was a hot day on Tuesday and he may have been a little dehydrated at the time- he wasn't drinking much water.  EMS was called but he didn't need to go to the ER.  He felt weak, but didn't need his decadron injection.  He felt better by the time EMS got to him.  He isn't working at [employer] anymore and that is a big change for him.  He feels better today.  He doubled his steroids yesterday but is back to normal dosing today.    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   Meds, vitals, and allergies reviewed.   ROS: See HPI.  Otherwise, noncontributory.  GEN: nad, alert and oriented HEENT: mucous membranes moist NECK: supple w/o LA CV: rrr.  PULM: ctab, no inc wob ABD: soft, +bs EXT: no edema SKIN: no acute rash Faint B hand tremor noted

## 2014-04-17 ENCOUNTER — Encounter: Payer: Self-pay | Admitting: *Deleted

## 2014-04-17 NOTE — Assessment & Plan Note (Signed)
Back to normal steroid dosing, would check his lytes today and proceed as is.  See notes on labs.

## 2014-05-11 ENCOUNTER — Other Ambulatory Visit: Payer: Self-pay | Admitting: *Deleted

## 2014-05-11 NOTE — Telephone Encounter (Signed)
Fax refill request, last refilled on 10/28/13 #30 with 5 additional refills

## 2014-05-12 MED ORDER — ZOLPIDEM TARTRATE 10 MG PO TABS
10.0000 mg | ORAL_TABLET | Freq: Every evening | ORAL | Status: DC | PRN
Start: ? — End: 2014-06-01

## 2014-05-12 NOTE — Telephone Encounter (Signed)
Rx called in as prescribed 

## 2014-05-12 NOTE — Telephone Encounter (Signed)
Please call in.  Thanks.   

## 2014-06-01 ENCOUNTER — Ambulatory Visit (INDEPENDENT_AMBULATORY_CARE_PROVIDER_SITE_OTHER): Payer: BC Managed Care – PPO | Admitting: Family Medicine

## 2014-06-01 ENCOUNTER — Encounter: Payer: Self-pay | Admitting: Family Medicine

## 2014-06-01 VITALS — BP 104/64 | HR 60 | Temp 97.8°F | Wt 168.8 lb

## 2014-06-01 DIAGNOSIS — G4700 Insomnia, unspecified: Secondary | ICD-10-CM

## 2014-06-01 MED ORDER — ZALEPLON 10 MG PO CAPS: 10.0000 mg | ORAL_CAPSULE | Freq: Every evening | ORAL | Status: DC | PRN

## 2014-06-01 NOTE — Patient Instructions (Addendum)
Please look at coverage for trazodone.  That may be another option if needed.  I'll await word on your disability decision.  Take care.  Glad to see you.

## 2014-06-01 NOTE — Progress Notes (Signed)
Pre visit review using our clinic review tool, if applicable. No additional management support is needed unless otherwise documented below in the visit note.  He has filed for disability.  His papers are in and is awaiting a decision.  He has had significant social upheaval with this.    Insomnia. No parasomnia but effect of Lorrin Mais was limited recently.  He gets no effect unless he takes it on a totally empty stomach and that doesn't work well with his meal schedule given his co-morbidities.  In taking with patient, he wanted to try sonata again- it may have been effective enough prev to give it another trial. No SI/HI. He is working on his sleep hygiene.  D/w pt.    Meds, vitals, and allergies reviewed.   ROS: See HPI.  Otherwise, noncontributory.  nad ncat Mmm rrr ctab Ext w/o edema

## 2014-06-02 NOTE — Assessment & Plan Note (Signed)
In taking with patient, he wanted to try sonata again- it may have been effective enough prev to give it another trial.  D/w pt about checking on coverage for trazodone in case that is needed. He agrees. He'll update me.

## 2014-06-10 ENCOUNTER — Telehealth: Payer: Self-pay | Admitting: *Deleted

## 2014-06-10 MED ORDER — ZOLPIDEM TARTRATE 10 MG PO TABS: 10.0000 mg | ORAL_TABLET | Freq: Every evening | ORAL | Status: DC | PRN

## 2014-06-10 NOTE — Telephone Encounter (Signed)
Received a faxed refill request from pharmacy for Zolpidem 10 mg, medication is no longer on list. Last office visit 06/01/14, last refill on 05/12/14 per pharmacy sheet. Is it okay to refill medication?

## 2014-06-10 NOTE — Telephone Encounter (Signed)
Noted, please call in.  Thanks.

## 2014-06-10 NOTE — Telephone Encounter (Signed)
Rx called to pharmacy as instructed. Patient notified by telephone. 

## 2014-06-10 NOTE — Telephone Encounter (Signed)
Spoke to patient and was advised that he wants to go back on Zolpidem because he does not like the [place] and does not feel that it works for him.

## 2014-06-10 NOTE — Telephone Encounter (Signed)
Clarify with patient to make sure this wasn't patient generated.  We had switched off from zolpidem.  Thanks.

## 2014-06-18 ENCOUNTER — Ambulatory Visit: Payer: BC Managed Care – PPO | Admitting: Family Medicine

## 2014-11-16 ENCOUNTER — Other Ambulatory Visit: Payer: Self-pay | Admitting: Family Medicine

## 2014-11-17 NOTE — Telephone Encounter (Signed)
Sent. Thanks.   

## 2014-12-28 ENCOUNTER — Ambulatory Visit (INDEPENDENT_AMBULATORY_CARE_PROVIDER_SITE_OTHER): Payer: 59 | Admitting: Family Medicine

## 2014-12-28 ENCOUNTER — Encounter: Payer: Self-pay | Admitting: Family Medicine

## 2014-12-28 ENCOUNTER — Ambulatory Visit (INDEPENDENT_AMBULATORY_CARE_PROVIDER_SITE_OTHER)
Admission: RE | Admit: 2014-12-28 | Discharge: 2014-12-28 | Disposition: A | Discharge status: Home / Self Care | Payer: 59 | Source: Ambulatory Visit | Attending: Family Medicine | Admitting: Family Medicine

## 2014-12-28 VITALS — BP 138/82 | HR 60 | Temp 98.7°F | Wt 197.2 lb

## 2014-12-28 DIAGNOSIS — R05 Cough: Secondary | ICD-10-CM

## 2014-12-28 DIAGNOSIS — R059 Cough, unspecified: Secondary | ICD-10-CM

## 2014-12-28 MED ORDER — PRAVASTATIN SODIUM 40 MG PO TABS: 40.0000 mg | ORAL_TABLET | Freq: Every day | ORAL | Status: DC

## 2014-12-28 MED ORDER — ALBUTEROL SULFATE HFA 108 (90 BASE) MCG/ACT IN AERS: 1.0000 | INHALATION_SPRAY | Freq: Four times a day (QID) | RESPIRATORY_TRACT | Status: AC | PRN

## 2014-12-28 MED ORDER — DOXYCYCLINE HYCLATE 100 MG PO TABS: 100.0000 mg | ORAL_TABLET | Freq: Two times a day (BID) | ORAL | Status: DC

## 2014-12-28 MED ORDER — ZOLPIDEM TARTRATE 10 MG PO TABS: 10.0000 mg | ORAL_TABLET | Freq: Every evening | ORAL | Status: DC | PRN

## 2014-12-28 NOTE — Progress Notes (Signed)
Pre visit review using our clinic review tool, if applicable. No additional management support is needed unless otherwise documented below in the visit note.  No ST and no head sx.  Mainly chest congestion.  Started about 5 days ago.  He increased his steroids per routine, started mucinex in the meantime.  Dark sputum.  Sleeping well recently, with [place] use.  Used some aleve, episodically.  No fevers, but he typically does not have fevers when sick.  No vomiting, no diarrhea.  No rash.  He increased his PO liquids in the meantime.   Recently with some wheeze  He has plans to est with [facility] in the near future.    Needs refill on ambien, no ADE on med.   PMH and SH reviewed  ROS: See HPI, otherwise noncontributory.  Meds, vitals, and allergies reviewed.   GEN: nad, alert and oriented HEENT: mucous membranes moist, tm w/o erythema, nasal exam w/o erythema, clear discharge noted,  OP with cobblestoning NECK: supple w/o LA CV: rrr.   PULM: ctab, no inc wob- there was a faint possible crackle on the LUL but not present on recheck at the [place].   EXT: no edema SKIN: no acute rash

## 2014-12-28 NOTE — Patient Instructions (Signed)
Go to the lab on the way out.  We'll contact you with your xray report. Start doxycycline today and use the inhaler if needed.  It can make you jittery, so you may want to start with only 1 puff.   Take care.  Glad to see you.

## 2014-12-29 ENCOUNTER — Encounter: Payer: Self-pay | Admitting: Family Medicine

## 2014-12-29 DIAGNOSIS — R059 Cough, unspecified: Secondary | ICD-10-CM | POA: Insufficient documentation

## 2014-12-29 DIAGNOSIS — R05 Cough: Secondary | ICD-10-CM | POA: Insufficient documentation

## 2014-12-29 NOTE — Assessment & Plan Note (Signed)
Would treat for bronchitis given his hx.  Start doxycycline today and use SABA if needed.  Dw pt about technique and potential side effects.  Still okay for outpatient f/u.  cxr reviewed.  See notes on cxr.  Call back as needed.

## 2015-03-15 ENCOUNTER — Encounter: Payer: Self-pay | Admitting: Family Medicine

## 2015-03-15 ENCOUNTER — Ambulatory Visit (INDEPENDENT_AMBULATORY_CARE_PROVIDER_SITE_OTHER): Payer: 59 | Admitting: Family Medicine

## 2015-03-15 VITALS — BP 136/76 | HR 76 | Temp 100.8°F | Wt 201.5 lb

## 2015-03-15 DIAGNOSIS — R05 Cough: Secondary | ICD-10-CM

## 2015-03-15 DIAGNOSIS — R059 Cough, unspecified: Secondary | ICD-10-CM

## 2015-03-15 MED ORDER — AMOXICILLIN-POT CLAVULANATE 875-125 MG PO TABS: 1.0000 | ORAL_TABLET | Freq: Two times a day (BID) | ORAL | Status: DC

## 2015-03-15 NOTE — Patient Instructions (Signed)
Start augmentin, use your inhaler, rest and drink plenty of fluids.  Update me as needed.  If short of breath, to ER.  Take care.  Glad to see you.

## 2015-03-15 NOTE — Assessment & Plan Note (Signed)
Would treat given his hx and duration.  With the rhinorrhea and chest sx, would use augmentin.  Still okay for outpatient f/u.   Continue higher dose of steroids given the acute illness.   Will continue SABA.  Rest and fluids.  Warm compresses to face for facial HA in meantime.  He agrees.

## 2015-03-15 NOTE — Progress Notes (Signed)
Pre visit review using our clinic review tool, if applicable. No additional management support is needed unless otherwise documented below in the visit note.  Treated 12/28/14 for persumed bronchitis.  CXR was clear.  He got better, then sx returned later on.  He has gotten worse in the last 2 weeks, esp more in the last week.  He already upped his steroid dose, as he does when ill.  Fever noted, likely started about 3 days ago.  Using inhaler with some effect for the cough, more productive after SABA use.  On mucinex.  L ear stuffy/tight.  Stuffy face.  No nose bleeds.  Using nasal saline.  Mult sick exposures.    He has been working more and that has been a strain for him.    Meds, vitals, and allergies reviewed.   ROS: See HPI.  Otherwise, noncontributory.  GEN: nad, alert and oriented HEENT: mucous membranes moist, tm w/o erythema, nasal exam w/o erythema, clear discharge noted,  OP with cobblestoning NECK: supple w/o LA CV: rrr.   PULM: coarse BS but o/wctab, no inc wob, no focal dec in BS EXT: no edema SKIN: no acute rash

## 2015-06-15 ENCOUNTER — Telehealth: Payer: Self-pay

## 2015-06-15 NOTE — Telephone Encounter (Signed)
Received a refill request for Zolpidem Tartrate 10mg  #30 tabs. Not on current medicine list. Okay to refill?

## 2015-06-16 MED ORDER — ZOLPIDEM TARTRATE 10 MG PO TABS: 10.0000 mg | ORAL_TABLET | Freq: Every evening | ORAL | Status: DC | PRN

## 2015-06-16 NOTE — Telephone Encounter (Signed)
Has been on, please call in.  Thanks.

## 2015-06-16 NOTE — Telephone Encounter (Signed)
Rx called to pharmacy as instructed. 

## 2015-07-01 ENCOUNTER — Other Ambulatory Visit: Payer: Self-pay | Admitting: Family Medicine

## 2015-07-02 NOTE — Telephone Encounter (Signed)
Left detailed message on voicemail.  

## 2015-07-02 NOTE — Telephone Encounter (Signed)
Sent.  Schedule CPE when possible just to go over routine items.  Thanks.

## 2015-12-02 ENCOUNTER — Other Ambulatory Visit: Payer: Self-pay | Admitting: *Deleted

## 2015-12-02 NOTE — Telephone Encounter (Signed)
Faxed refill request. Medication not on current meds list, added for refill purposes.  Last Filled:   #30 on 06/16/15 with 5 RF's. Please advise.

## 2015-12-03 MED ORDER — ZOLPIDEM TARTRATE 10 MG PO TABS: 10.0000 mg | ORAL_TABLET | Freq: Every evening | ORAL | Status: DC | PRN

## 2015-12-03 NOTE — Telephone Encounter (Signed)
Spoke to patient to notify CPE is due.  He will call back to schedule.  Prescription called to pharmacy.

## 2015-12-03 NOTE — Telephone Encounter (Signed)
Please call in.  Due for CPE.  Thanks.

## 2016-05-22 ENCOUNTER — Other Ambulatory Visit: Payer: Self-pay | Admitting: Family Medicine

## 2016-05-22 NOTE — Telephone Encounter (Signed)
Please call in.  Thanks.   

## 2016-05-22 NOTE — Telephone Encounter (Signed)
Received refill request electronically Last refill 12/03/15 #30/5 Last office 03/15/15 Appt scheduled 08/03/16

## 2016-05-22 NOTE — Telephone Encounter (Signed)
Medication phoned to pharmacy.  

## 2016-07-10 ENCOUNTER — Other Ambulatory Visit: Payer: Self-pay | Admitting: Family Medicine

## 2016-07-28 ENCOUNTER — Other Ambulatory Visit: Payer: Self-pay | Admitting: Neurosurgery

## 2016-07-28 DIAGNOSIS — D497 Neoplasm of unspecified behavior of endocrine glands and other parts of nervous system: Secondary | ICD-10-CM

## 2016-08-03 ENCOUNTER — Encounter: Payer: Self-pay | Admitting: Family Medicine

## 2016-08-07 ENCOUNTER — Encounter: Payer: Self-pay | Admitting: Family Medicine

## 2016-08-14 ENCOUNTER — Ambulatory Visit
Admission: RE | Admit: 2016-08-14 | Discharge: 2016-08-14 | Disposition: A | Discharge status: Home / Self Care | Payer: BLUE CROSS/BLUE SHIELD | Source: Ambulatory Visit | Attending: Neurosurgery | Admitting: Neurosurgery

## 2016-08-14 DIAGNOSIS — D497 Neoplasm of unspecified behavior of endocrine glands and other parts of nervous system: Secondary | ICD-10-CM

## 2016-08-14 MED ORDER — GADOBENATE DIMEGLUMINE 529 MG/ML IV SOLN
10.0000 mL | Freq: Once | INTRAVENOUS | Status: AC | PRN
  Administered 2016-08-14: 10 mL via INTRAVENOUS

## 2016-08-21 ENCOUNTER — Ambulatory Visit (INDEPENDENT_AMBULATORY_CARE_PROVIDER_SITE_OTHER): Payer: BLUE CROSS/BLUE SHIELD | Admitting: Family Medicine

## 2016-08-21 ENCOUNTER — Encounter: Payer: Self-pay | Admitting: Family Medicine

## 2016-08-21 VITALS — BP 142/84 | HR 63 | Temp 98.5°F | Ht 69.0 in | Wt 220.5 lb

## 2016-08-21 DIAGNOSIS — D443 Neoplasm of uncertain behavior of pituitary gland: Secondary | ICD-10-CM | POA: Diagnosis not present

## 2016-08-21 DIAGNOSIS — Z1211 Encounter for screening for malignant neoplasm of colon: Secondary | ICD-10-CM

## 2016-08-21 DIAGNOSIS — G4700 Insomnia, unspecified: Secondary | ICD-10-CM | POA: Diagnosis not present

## 2016-08-21 DIAGNOSIS — E7800 Pure hypercholesterolemia, unspecified: Secondary | ICD-10-CM | POA: Diagnosis not present

## 2016-08-21 DIAGNOSIS — Z23 Encounter for immunization: Secondary | ICD-10-CM

## 2016-08-21 DIAGNOSIS — Z119 Encounter for screening for infectious and parasitic diseases, unspecified: Secondary | ICD-10-CM

## 2016-08-21 DIAGNOSIS — D444 Neoplasm of uncertain behavior of craniopharyngeal duct: Secondary | ICD-10-CM

## 2016-08-21 MED ORDER — PRAVASTATIN SODIUM 40 MG PO TABS: 40.0000 mg | ORAL_TABLET | Freq: Every day | ORAL | 10 refills | Status: DC

## 2016-08-21 NOTE — Patient Instructions (Addendum)
Go to the lab on the way out.  We'll contact you with your lab report. Check to see if cologuard is covered.  Update me when you find out.  Take care.  Glad to see you.

## 2016-08-21 NOTE — Progress Notes (Signed)
Pre visit review using our clinic review tool, if applicable. No additional management support is needed unless otherwise documented below in the visit note. 

## 2016-08-21 NOTE — Progress Notes (Signed)
Recent MRI noted, d/w pt.  Unchanged from prev.    He has had his labs done per Dr. Chalmers Cater.  He is still on testosterone/mult hormone replacement per endo.    Insomnia.  Still needing ambien nightly.  No ADE on med.  Does have effect from med, able to sleep with use.  He has good sleep hygiene o/w.  His BP is normal in AM, higher later in the day or at a MD visit.  He admittedly gets anxious about going to a MD's office.  D/w pt.  He has cut out almost all caffeine in the meantime.    Still on propranolol for tremor, with variable effect and he is considering follow up.  No ADE on med.  He has noted vocal tremor.    PMH and SH reviewed  ROS: Per HPI unless specifically indicated in ROS section   Meds, vitals, and allergies reviewed.   GEN: nad, alert and oriented HEENT: mucous membranes moist NECK: supple w/o LA CV: rrr PULM: ctab, no inc wob ABD: soft, +bs EXT: no edema SKIN: no acute rash Mild B hand and vocal tremor noted.

## 2016-08-22 DIAGNOSIS — Z1211 Encounter for screening for malignant neoplasm of colon: Secondary | ICD-10-CM | POA: Insufficient documentation

## 2016-08-22 DIAGNOSIS — Z119 Encounter for screening for infectious and parasitic diseases, unspecified: Secondary | ICD-10-CM | POA: Insufficient documentation

## 2016-08-22 LAB — HEPATITIS C ANTIBODY: HCV Ab: NEGATIVE

## 2016-08-22 NOTE — Assessment & Plan Note (Signed)
D/w patient Brian Stokes:[id number] for colon cancer screening.  Risks and benefits were discussed and patient voiced understanding.  Pt elects to check on coverage for cologuard and update me.  Handout given.  He would like to avoid colonoscopy if possible, given his other conditions. This is reasonable.

## 2016-08-22 NOTE — Assessment & Plan Note (Signed)
No sig change on recent MRI.

## 2016-08-22 NOTE — Assessment & Plan Note (Signed)
Pt opts in for HCV screening.  D/w pt re: routine screening.   HIV screen prev neg ~1990 per patient report.

## 2016-08-22 NOTE — Assessment & Plan Note (Signed)
Still needing ambien nightly.  No ADE on med.  Does have effect from med, able to sleep with use.  He has good sleep hygiene o/w. >25 minutes spent in face to face time with patient, >50% spent in counselling or coordination of care.

## 2016-08-22 NOTE — Assessment & Plan Note (Signed)
Labs done per endo clinic, no change in meds.

## 2016-10-05 ENCOUNTER — Other Ambulatory Visit: Payer: Self-pay | Admitting: Family Medicine

## 2016-11-09 ENCOUNTER — Other Ambulatory Visit: Payer: Self-pay | Admitting: Family Medicine

## 2016-11-09 NOTE — Telephone Encounter (Signed)
Pt left v/m requesting status of zolpidem refill; Dr Damita Dunnings out of office. Pt request cb when refilled.

## 2016-11-09 NOTE — Telephone Encounter (Signed)
plz phone in. 

## 2016-11-09 NOTE — Telephone Encounter (Signed)
Last filled on 05/22/16 #30 with 5 additional refills, pt had CPE on 08/21/16

## 2016-11-10 NOTE — Telephone Encounter (Signed)
Rx called in to pharmacy. 

## 2016-11-14 NOTE — Telephone Encounter (Signed)
Thanks

## 2017-01-05 ENCOUNTER — Other Ambulatory Visit: Payer: Self-pay | Admitting: *Deleted

## 2017-01-05 NOTE — Telephone Encounter (Signed)
Ok to refill? Last filled 11/09/16 #30 1RF

## 2017-01-06 MED ORDER — ZOLPIDEM TARTRATE 10 MG PO TABS: 10.0000 mg | ORAL_TABLET | Freq: Every evening | ORAL | 2 refills | Status: DC | PRN

## 2017-01-06 NOTE — Telephone Encounter (Signed)
Please call in.  Thanks.   

## 2017-01-08 NOTE — Telephone Encounter (Signed)
Rx called to pharmacy as instructed. 

## 2017-04-03 ENCOUNTER — Other Ambulatory Visit: Payer: Self-pay | Admitting: Family Medicine

## 2017-04-04 NOTE — Telephone Encounter (Signed)
Last office visit 08/21/2016.  Last refilled 01/06/2017 for #30 with no refills.  Ok to refill?

## 2017-04-04 NOTE — Telephone Encounter (Signed)
Please call in.  Thanks.   

## 2017-04-04 NOTE — Telephone Encounter (Signed)
[phone]

## 2017-05-30 ENCOUNTER — Other Ambulatory Visit: Payer: Self-pay | Admitting: Family Medicine

## 2017-05-30 NOTE — Telephone Encounter (Signed)
Received refill electronically Last refill 04/04/17 #30/1 Last office visit 08/21/16

## 2017-05-31 NOTE — Telephone Encounter (Signed)
Please call in.  Thanks.   

## 2017-05-31 NOTE — Telephone Encounter (Signed)
Medication phoned to pharmacy.  

## 2017-07-25 ENCOUNTER — Other Ambulatory Visit: Payer: Self-pay | Admitting: Family Medicine

## 2017-07-25 NOTE — Telephone Encounter (Signed)
Received refill electronically Last refill 05/31/17 #30/1 Last office visit 08/21/16

## 2017-07-26 NOTE — Telephone Encounter (Signed)
Please call in.  Due for f/u vs CPE this fall.  Thanks.

## 2017-07-26 NOTE — Telephone Encounter (Signed)
Medication phoned to pharmacy.  Patient advised to schedule FU vs CPE this fall.

## 2017-09-19 ENCOUNTER — Other Ambulatory Visit: Payer: Self-pay | Admitting: Family Medicine

## 2017-09-20 NOTE — Telephone Encounter (Signed)
Please call in.  Thanks.   

## 2017-09-20 NOTE — Telephone Encounter (Signed)
Requesting: Zolpidem Contract: None UDS: None Last OV: 10.02.2017 Next OV: 11.27.2018 Last Refill: 10.04.2018   Please advise

## 2017-09-21 ENCOUNTER — Other Ambulatory Visit: Payer: Self-pay | Admitting: *Deleted

## 2017-09-21 NOTE — Telephone Encounter (Signed)
Rx called to pharmacy as instructed. 

## 2017-09-21 NOTE — Telephone Encounter (Signed)
Faxed refill request.  Zolpidem Last office visit:   08/21/16   CPE scheduled 10/16/17 Last Filled:   09/20/17 Please advise.

## 2017-09-23 NOTE — Telephone Encounter (Signed)
Per EMR was already called in.  If there is a concern about this, please let me know.  Thanks.

## 2017-10-16 ENCOUNTER — Encounter: Payer: BLUE CROSS/BLUE SHIELD | Admitting: Family Medicine

## 2017-10-17 ENCOUNTER — Other Ambulatory Visit: Payer: Self-pay | Admitting: Family Medicine

## 2017-10-17 NOTE — Telephone Encounter (Signed)
Please call in.  Thanks.   

## 2017-10-17 NOTE — Telephone Encounter (Signed)
Rx called to pharmacy

## 2017-10-17 NOTE — Telephone Encounter (Signed)
Electronic refill Last refill 09/20/17 #30 Last office visit 08/21/16 Appointment scheduled for 11/05/17

## 2017-10-19 ENCOUNTER — Other Ambulatory Visit: Payer: Self-pay | Admitting: Family Medicine

## 2017-11-05 ENCOUNTER — Ambulatory Visit: Payer: BLUE CROSS/BLUE SHIELD | Admitting: Family Medicine

## 2017-11-05 ENCOUNTER — Encounter: Payer: Self-pay | Admitting: Family Medicine

## 2017-11-05 VITALS — BP 122/76 | HR 70 | Temp 99.0°F | Ht 69.0 in | Wt 217.8 lb

## 2017-11-05 DIAGNOSIS — E230 Hypopituitarism: Secondary | ICD-10-CM

## 2017-11-05 DIAGNOSIS — Z23 Encounter for immunization: Secondary | ICD-10-CM

## 2017-11-05 DIAGNOSIS — Z0000 Encounter for general adult medical examination without abnormal findings: Secondary | ICD-10-CM

## 2017-11-05 DIAGNOSIS — E785 Hyperlipidemia, unspecified: Secondary | ICD-10-CM

## 2017-11-05 DIAGNOSIS — Z7189 Other specified counseling: Secondary | ICD-10-CM

## 2017-11-05 DIAGNOSIS — G4700 Insomnia, unspecified: Secondary | ICD-10-CM

## 2017-11-05 DIAGNOSIS — R251 Tremor, unspecified: Secondary | ICD-10-CM

## 2017-11-05 DIAGNOSIS — E7800 Pure hypercholesterolemia, unspecified: Secondary | ICD-10-CM

## 2017-11-05 MED ORDER — PRAVASTATIN SODIUM 40 MG PO TABS: 40.0000 mg | ORAL_TABLET | Freq: Every day | ORAL | 12 refills | Status: DC

## 2017-11-05 MED ORDER — ZOLPIDEM TARTRATE 10 MG PO TABS: 10.0000 mg | ORAL_TABLET | Freq: Every evening | ORAL | 5 refills | Status: DC | PRN

## 2017-11-05 NOTE — Progress Notes (Signed)
CPE- See plan.  Routine anticipatory guidance given to patient.  See health maintenance.  The possibility exists that previously documented standard health maintenance information may have been brought forward from a previous encounter into this note.  If needed, that same information has been updated to reflect the current situation based on today's encounter.    Routine anticipatory guidance given to patient.  See health maintenance. Colon CA screening d/w pt.  Encouraged.  See AVS.  PSA usually per endo per patient.  D/w pt.   Flu shot today.   PNA shot 2011 Tetanus per old records had been done at endo clinic.  Advance directive d/w pt. Youngest brother Jamonte Curfman designated if patient were incapacitated.    Elevated Cholesterol: Using medications without problems:yes Muscle aches: no Diet compliance:yes, healthy diet.  Exercise:yes, as tolerated.  Encouraged.    Insomnia.  Compliant, with relief, no ADE on med. He limits screentime at the end of the day.  Reading a book at night helps.    Panhypopit condition per endocrine.  I'll defer.    He had f/u MRI and will f/u with Dr. Carloyn Manner.    D/w pt about tremor.  We can refer to neuro if needed.  Still on propranolol.  Propranolol helps some.  He has cut out caffeine.  PMH and SH reviewed  Meds, vitals, and allergies reviewed.   ROS: Per HPI.  Unless specifically indicated otherwise in HPI, the patient denies:  General: fever. Eyes: acute vision changes ENT: sore throat Cardiovascular: chest pain Respiratory: SOB GI: vomiting GU: dysuria Musculoskeletal: acute back pain Derm: acute rash Neuro: acute motor dysfunction Psych: worsening mood Endocrine: polydipsia Heme: bleeding Allergy: hayfever  GEN: nad, alert and oriented HEENT: mucous membranes moist NECK: supple w/o LA CV: rrr. 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  Not  changing in size.  Not itchy.   Hand and head tremor noted.

## 2017-11-05 NOTE — Assessment & Plan Note (Signed)
Advance directive d/w pt. Youngest brother Mark Hermans designated if patient were incapacitated. 

## 2017-11-05 NOTE — Patient Instructions (Addendum)
Let me know if you want to go for colonoscopy vs cologuard.  Take care.  Glad to see you.  Go to the lab on the way out.  We'll contact you with your lab report. Update me as needed.   I'll await the notes from your other docs.

## 2017-11-06 ENCOUNTER — Encounter: Payer: Self-pay | Admitting: Family Medicine

## 2017-11-06 DIAGNOSIS — R251 Tremor, unspecified: Secondary | ICD-10-CM | POA: Insufficient documentation

## 2017-11-06 DIAGNOSIS — R21 Rash and other nonspecific skin eruption: Secondary | ICD-10-CM | POA: Insufficient documentation

## 2017-11-06 LAB — COMPREHENSIVE METABOLIC PANEL
ALT: 23 U/L (ref 0–53)
AST: 24 U/L (ref 0–37)
Albumin: 4.1 g/dL (ref 3.5–5.2)
Alkaline Phosphatase: 73 U/L (ref 39–117)
BUN: 15 mg/dL (ref 6–23)
CO2: 30 mEq/L (ref 19–32)
Calcium: 8.9 mg/dL (ref 8.4–10.5)
Chloride: 105 mEq/L (ref 96–112)
Creatinine, Ser: 0.91 mg/dL (ref 0.40–1.50)
GFR: 88.91 mL/min (ref >60.00)
Glucose, Bld: 77 mg/dL (ref 70–99)
Potassium: 4.4 mEq/L (ref 3.5–5.1)
Sodium: 141 mEq/L (ref 135–145)
Total Bilirubin: 0.5 mg/dL (ref 0.2–1.2)
Total Protein: 6.4 g/dL (ref 6.0–8.3)

## 2017-11-06 LAB — LDL CHOLESTEROL, DIRECT: Direct LDL: 97 mg/dL

## 2017-11-06 LAB — LIPID PANEL
Cholesterol: 132 mg/dL (ref 0–200)
HDL: 33.2 mg/dL — ABNORMAL LOW (ref >39.00)
NonHDL: 98.68
Total CHOL/HDL Ratio: 4
Triglycerides: 202 mg/dL — ABNORMAL HIGH (ref 0.0–149.0)
VLDL: 40.4 mg/dL — ABNORMAL HIGH (ref 0.0–40.0)

## 2017-11-06 NOTE — Assessment & Plan Note (Signed)
Recheck labs today.  Continue work on diet and exercise.  No change in meds at this point.  He agrees.

## 2017-11-06 NOTE — Assessment & Plan Note (Signed)
Compliant, with relief, no ADE on med. He limits screentime at the end of the day.  Reading a book at night helps.   Continue as is.

## 2017-11-06 NOTE — Assessment & Plan Note (Signed)
Continue propranolol.  Propranolol helps some.  He has cut out caffeine.  We can refer to neurology in the future if needed/desired, d/w pt.

## 2017-11-06 NOTE — Assessment & Plan Note (Signed)
Colon CA screening d/w pt.  Encouraged.  See AVS.  PSA usually per endo per patient.  D/w pt.   Flu shot today.   PNA shot 2011 Tetanus per old records had been done at endo clinic.  Advance directive d/w pt. Youngest brother Torrez Renfroe designated if patient were incapacitated.

## 2017-11-06 NOTE — Assessment & Plan Note (Signed)
Per endocrine clinic.  I'll defer.

## 2017-11-15 ENCOUNTER — Other Ambulatory Visit: Payer: Self-pay | Admitting: Family Medicine

## 2017-11-15 NOTE — Telephone Encounter (Signed)
Electronic refill request. Zolpidem Last office visit:   11/05/17 Last Filled:    30 tablet 5 11/05/2017  Please advise.

## 2017-11-16 NOTE — Telephone Encounter (Signed)
Recently printed, given to patient.  Thanks.

## 2017-11-16 NOTE — Telephone Encounter (Signed)
Patient advised.

## 2018-01-29 ENCOUNTER — Telehealth: Payer: Self-pay | Admitting: *Deleted

## 2018-01-29 NOTE — Telephone Encounter (Signed)
PA submitted thru CMM for Zolpidem, awaiting response.

## 2018-04-18 DIAGNOSIS — E2749 Other adrenocortical insufficiency: Secondary | ICD-10-CM | POA: Diagnosis not present

## 2018-04-18 DIAGNOSIS — Z125 Encounter for screening for malignant neoplasm of prostate: Secondary | ICD-10-CM | POA: Diagnosis not present

## 2018-04-18 DIAGNOSIS — D443 Neoplasm of uncertain behavior of pituitary gland: Secondary | ICD-10-CM | POA: Diagnosis not present

## 2018-04-18 DIAGNOSIS — E038 Other specified hypothyroidism: Secondary | ICD-10-CM | POA: Diagnosis not present

## 2018-04-18 DIAGNOSIS — E291 Testicular hypofunction: Secondary | ICD-10-CM | POA: Diagnosis not present

## 2018-04-22 ENCOUNTER — Other Ambulatory Visit: Payer: Self-pay | Admitting: Family Medicine

## 2018-04-22 NOTE — Telephone Encounter (Signed)
Electronic refill request Last refill 11/05/17 #30/5 Last office visit 11/05/17

## 2018-04-23 DIAGNOSIS — E038 Other specified hypothyroidism: Secondary | ICD-10-CM | POA: Diagnosis not present

## 2018-04-23 DIAGNOSIS — E291 Testicular hypofunction: Secondary | ICD-10-CM | POA: Diagnosis not present

## 2018-04-23 DIAGNOSIS — R251 Tremor, unspecified: Secondary | ICD-10-CM | POA: Diagnosis not present

## 2018-04-23 DIAGNOSIS — D443 Neoplasm of uncertain behavior of pituitary gland: Secondary | ICD-10-CM | POA: Diagnosis not present

## 2018-04-23 DIAGNOSIS — Z7952 Long term (current) use of systemic steroids: Secondary | ICD-10-CM | POA: Diagnosis not present

## 2018-04-23 DIAGNOSIS — E2749 Other adrenocortical insufficiency: Secondary | ICD-10-CM | POA: Diagnosis not present

## 2018-04-23 NOTE — Telephone Encounter (Signed)
Sent. Thanks.   

## 2018-08-23 DIAGNOSIS — Z23 Encounter for immunization: Secondary | ICD-10-CM | POA: Diagnosis not present

## 2018-09-20 DIAGNOSIS — D497 Neoplasm of unspecified behavior of endocrine glands and other parts of nervous system: Secondary | ICD-10-CM | POA: Diagnosis not present

## 2018-10-09 ENCOUNTER — Other Ambulatory Visit: Payer: Self-pay | Admitting: Family Medicine

## 2018-10-09 NOTE — Telephone Encounter (Signed)
Electronic refill request Ambien Last office visit 11/05/17 Last refill 04/23/18 #30/5

## 2018-10-10 ENCOUNTER — Encounter: Payer: Self-pay | Admitting: *Deleted

## 2018-10-10 NOTE — Telephone Encounter (Signed)
Letter mailed

## 2018-10-10 NOTE — Telephone Encounter (Signed)
CPE vs AMW visit when possible.   rx sent.  Thanks.

## 2018-12-03 ENCOUNTER — Other Ambulatory Visit: Payer: Self-pay | Admitting: Family Medicine

## 2018-12-03 NOTE — Telephone Encounter (Signed)
Electronic refill request. Zolpidem Last office visit:   11/05/17 Last Filled:    30 tablet 1 10/10/2018  Please advise.

## 2018-12-04 NOTE — Telephone Encounter (Signed)
Sent.  Has follow-up pending.  Thanks.

## 2018-12-12 ENCOUNTER — Other Ambulatory Visit: Payer: Self-pay | Admitting: Family Medicine

## 2018-12-31 DIAGNOSIS — D497 Neoplasm of unspecified behavior of endocrine glands and other parts of nervous system: Secondary | ICD-10-CM | POA: Diagnosis not present

## 2019-01-06 ENCOUNTER — Other Ambulatory Visit (INDEPENDENT_AMBULATORY_CARE_PROVIDER_SITE_OTHER): Payer: Medicare HMO

## 2019-01-06 ENCOUNTER — Other Ambulatory Visit: Payer: Self-pay | Admitting: Family Medicine

## 2019-01-06 DIAGNOSIS — E785 Hyperlipidemia, unspecified: Secondary | ICD-10-CM | POA: Diagnosis not present

## 2019-01-06 LAB — COMPREHENSIVE METABOLIC PANEL
ALT: 24 U/L (ref 0–53)
AST: 22 U/L (ref 0–37)
Albumin: 4.2 g/dL (ref 3.5–5.2)
Alkaline Phosphatase: 51 U/L (ref 39–117)
BUN: 12 mg/dL (ref 6–23)
CO2: 28 mEq/L (ref 19–32)
Calcium: 9.1 mg/dL (ref 8.4–10.5)
Chloride: 106 mEq/L (ref 96–112)
Creatinine, Ser: 0.87 mg/dL (ref 0.40–1.50)
GFR: 87.79 mL/min (ref >60.00)
Glucose, Bld: 88 mg/dL (ref 70–99)
Potassium: 4.3 mEq/L (ref 3.5–5.1)
Sodium: 140 mEq/L (ref 135–145)
Total Bilirubin: 0.6 mg/dL (ref 0.2–1.2)
Total Protein: 6.4 g/dL (ref 6.0–8.3)

## 2019-01-06 LAB — LIPID PANEL
Cholesterol: 146 mg/dL (ref 0–200)
HDL: 38.2 mg/dL — ABNORMAL LOW (ref >39.00)
LDL Cholesterol: 76 mg/dL (ref 0–99)
NonHDL: 107.86
Total CHOL/HDL Ratio: 4
Triglycerides: 158 mg/dL — ABNORMAL HIGH (ref 0.0–149.0)
VLDL: 31.6 mg/dL (ref 0.0–40.0)

## 2019-01-13 ENCOUNTER — Encounter: Payer: Self-pay | Admitting: Family Medicine

## 2019-01-13 ENCOUNTER — Ambulatory Visit (INDEPENDENT_AMBULATORY_CARE_PROVIDER_SITE_OTHER): Payer: Medicare HMO | Admitting: Family Medicine

## 2019-01-13 VITALS — BP 128/76 | HR 67 | Temp 98.2°F | Ht 69.0 in | Wt 238.6 lb

## 2019-01-13 DIAGNOSIS — R251 Tremor, unspecified: Secondary | ICD-10-CM

## 2019-01-13 DIAGNOSIS — M62838 Other muscle spasm: Secondary | ICD-10-CM

## 2019-01-13 DIAGNOSIS — G4700 Insomnia, unspecified: Secondary | ICD-10-CM

## 2019-01-13 DIAGNOSIS — E7800 Pure hypercholesterolemia, unspecified: Secondary | ICD-10-CM | POA: Diagnosis not present

## 2019-01-13 DIAGNOSIS — Z23 Encounter for immunization: Secondary | ICD-10-CM | POA: Diagnosis not present

## 2019-01-13 DIAGNOSIS — Z1211 Encounter for screening for malignant neoplasm of colon: Secondary | ICD-10-CM

## 2019-01-13 DIAGNOSIS — Z7189 Other specified counseling: Secondary | ICD-10-CM

## 2019-01-13 DIAGNOSIS — R21 Rash and other nonspecific skin eruption: Secondary | ICD-10-CM | POA: Diagnosis not present

## 2019-01-13 DIAGNOSIS — Z0000 Encounter for general adult medical examination without abnormal findings: Secondary | ICD-10-CM

## 2019-01-13 DIAGNOSIS — E230 Hypopituitarism: Secondary | ICD-10-CM

## 2019-01-13 MED ORDER — PRAVASTATIN SODIUM 40 MG PO TABS: 40.0000 mg | ORAL_TABLET | Freq: Every day | ORAL | 3 refills | Status: DC

## 2019-01-13 MED ORDER — TRIAMCINOLONE ACETONIDE 0.1 % EX CREA: 1.0000 "application " | TOPICAL_CREAM | Freq: Two times a day (BID) | CUTANEOUS | 1 refills | Status: DC | PRN

## 2019-01-13 NOTE — Patient Instructions (Addendum)
Please check with Dr. Chalmers Cater about the timeline for a follow up MRI.   We'll get cologuard ordered.  Pneumonia vaccine today.  Take care.  Glad to see you.

## 2019-01-13 NOTE — Progress Notes (Signed)
I have personally reviewed the Medicare Annual Wellness questionnaire and have noted 1. The patient's medical and social history 2. Their use of alcohol, tobacco or illicit drugs 3. Their current medications and supplements 4. The patient's functional ability including ADL's, fall risks, home safety risks and hearing or visual             impairment. 5. Diet and physical activities 6. Evidence for depression or mood disorders  The patients weight, height, BMI have been recorded in the chart and visual acuity is per eye clinic.  I have made referrals, counseling and provided education to the patient based review of the above and I have provided the pt with a written personalized care plan for preventive services.  Provider list updated- see scanned forms.  Routine anticipatory guidance given to patient.  See health maintenance. The possibility exists that previously documented standard health maintenance information may have been brought forward from a previous encounter into this note.  If needed, that same information has been updated to reflect the current situation based on today's encounter.    Flu 08/2018 Shingles out of stock PNA 2020 Tetanus 2013 D/w patient [id number] for colon cancer screening, including IFOB vs. colonoscopy.  Risks and benefits of both were discussed and patient voiced understanding.  Pt elects [id number].   Prostate cancer screening per endo.   Advance directive d/w pt. Youngest brother Haseeb Fiallos designated if patient were incapacitated.   Cognitive function addressed- see scanned forms- and if abnormal then additional documentation follows.   He had a cold recently but that resolved.    Elevated Cholesterol: Using medications without problems: yes Muscle aches: no Diet compliance: encouraged Exercise:encouraged Labs d/w pt.    D/w pt about tremor.  We can refer to neuro if needed.  Still on propranolol.  Propranolol helps some. He declined neuro  referral at this point.    Insomnia.  Med used with relief.  No ADE on med.  Compliant.  No parasomnias.     Pituitary hx d/w pt.  I asked him to check with Dr. Chalmers Cater about the timeline for f/u MRI.  Dr. Carloyn Manner has retired and I can fill flexeril for patient if needed, prev rx'd by Dr. Carloyn Manner.    PMH and SH reviewed  Meds, vitals, and allergies reviewed.   ROS: Per HPI.  Unless specifically indicated otherwise in HPI, the patient denies:  General: fever. Eyes: acute vision changes ENT: sore throat Cardiovascular: chest pain Respiratory: SOB GI: vomiting GU: dysuria Musculoskeletal: acute back pain Derm: acute rash Neuro: acute motor dysfunction Psych: worsening mood Endocrine: polydipsia Heme: bleeding Allergy: hayfever  GEN: nad, alert and oriented HEENT: mucous membranes moist NECK: supple w/o LA CV: rrr. PULM: ctab, no inc wob ABD: soft, +bs EXT: no edema SKIN: no acute rash but 3x1.5cm plaque on the R medial thigh, unchanged per patient report.   Tremor noted.  At baseline.

## 2019-01-16 DIAGNOSIS — M62838 Other muscle spasm: Secondary | ICD-10-CM | POA: Insufficient documentation

## 2019-01-16 NOTE — Assessment & Plan Note (Signed)
Med used with relief.  No ADE on med.  Compliant.  No parasomnias.

## 2019-01-16 NOTE — Assessment & Plan Note (Signed)
Continue statin, diet, exercise.  Labs discussed with patient.

## 2019-01-16 NOTE — Assessment & Plan Note (Signed)
He should be able to use triamcinolone, as needed.  Update me as needed.

## 2019-01-16 NOTE — Assessment & Plan Note (Signed)
D/w pt about tremor.  We can refer to neuro if needed.  Still on propranolol.  Propranolol helps some. He declined neuro referral at this point.

## 2019-01-16 NOTE — Assessment & Plan Note (Signed)
Flu 08/2018 Shingles out of stock PNA 2020 Tetanus 2013 D/w patient [id number] for colon cancer screening, including IFOB vs. colonoscopy.  Risks and benefits of both were discussed and patient voiced understanding.  Pt elects [id number].   Prostate cancer screening per endo.   Advance directive d/w pt. Youngest brother Richie Bonanno designated if patient were incapacitated.   Cognitive function addressed- see scanned forms- and if abnormal then additional documentation follows.

## 2019-01-16 NOTE — Assessment & Plan Note (Signed)
He uses cyclobenzaprine as needed and I can refill that in the future.

## 2019-01-16 NOTE — Assessment & Plan Note (Signed)
Per endocrine.  Requesting records.

## 2019-01-16 NOTE — Assessment & Plan Note (Signed)
Advance directive d/w pt. Youngest brother Mark Maturin designated if patient were incapacitated. 

## 2019-01-28 ENCOUNTER — Encounter: Payer: Self-pay | Admitting: Family Medicine

## 2019-01-28 LAB — LAB REPORT - SCANNED
Prostate Specific Ag, Serum: 1.2
Prostate Specific Ag, Serum: 1.38
Prostate Specific Ag, Serum: 1.42

## 2019-02-03 ENCOUNTER — Other Ambulatory Visit: Payer: Self-pay | Admitting: Family Medicine

## 2019-02-03 NOTE — Telephone Encounter (Signed)
Electronic refill request. Zolpidem Last office visit:   01/13/2019 Last Filled:    30 tablet 1 12/04/2018  Please advise.

## 2019-02-04 NOTE — Telephone Encounter (Signed)
Sent. Thanks.   

## 2019-03-13 ENCOUNTER — Other Ambulatory Visit: Payer: Self-pay | Admitting: Family Medicine

## 2019-03-13 NOTE — Telephone Encounter (Signed)
Refill request pravastatin  LAST OV: 01/13/19 Annual NEXT OV: not scheduled Last refill: #90, 3 refills on 01/13/19 but appears it was printed and possibly given to patient at 01/13/19 visit.  I have left message for patient to please call us back to clarify.  No details left on message as do not have dpr permission.  Will pend approval until can clarify with patient whether he has turned in his written script as refill is not appropriate if already filled per rx on 01/13/19.

## 2019-03-14 NOTE — Telephone Encounter (Signed)
Spoke with patient. Patient can not find his hard copy RX for Pravastatin that was given to him on his last visit. [facility]  [place] [facility]. Patient understood and said thank you.

## 2019-03-28 ENCOUNTER — Other Ambulatory Visit: Payer: Self-pay | Admitting: Family Medicine

## 2019-03-31 ENCOUNTER — Other Ambulatory Visit: Payer: Self-pay | Admitting: *Deleted

## 2019-03-31 MED ORDER — PROPRANOLOL HCL 20 MG PO TABS: 20.0000 mg | ORAL_TABLET | Freq: Two times a day (BID) | ORAL | 3 refills | Status: DC

## 2019-03-31 NOTE — Telephone Encounter (Signed)
Last filled 03-06-19 #30 Last OV 01-13-19 No Future OV [facility]

## 2019-03-31 NOTE — Telephone Encounter (Signed)
Patient left a voicemail stating that he is needing a new script sent to the pharmacy for his Propranolol. Patient stated that he had been getting this from Dr. Carloyn Manner, but he has retired. Patient stated that he is not sure if he needs to get this from Dr. Damita Dunnings or Dr. Arville Lime?  Patient stated that he does not know which doctor the pharmacy sent it to.

## 2019-03-31 NOTE — Telephone Encounter (Addendum)
Okay for me to fill.  Sent.  Thanks.

## 2019-04-01 NOTE — Telephone Encounter (Signed)
Sent. Thanks.   

## 2019-04-09 ENCOUNTER — Other Ambulatory Visit: Payer: Self-pay | Admitting: *Deleted

## 2019-04-09 NOTE — Telephone Encounter (Signed)
Patient left a voicemail stating that he needs a refill on his Cyclobenzaprine. Patient stated that he is on several medications and they were split up between Dr. Damita Dunnings and Dr. Chalmers Cater. Patient stated that he has also left a message for Dr, Chalmers Cater about the refill also, Patient [facility] Last office visit 01/13/19

## 2019-04-10 MED ORDER — CYCLOBENZAPRINE HCL 10 MG PO TABS: 10.0000 mg | ORAL_TABLET | Freq: Three times a day (TID) | ORAL | 1 refills | Status: AC | PRN

## 2019-04-10 NOTE — Telephone Encounter (Signed)
Sent. Thanks.   

## 2019-04-17 DIAGNOSIS — E291 Testicular hypofunction: Secondary | ICD-10-CM | POA: Diagnosis not present

## 2019-04-17 DIAGNOSIS — Z125 Encounter for screening for malignant neoplasm of prostate: Secondary | ICD-10-CM | POA: Diagnosis not present

## 2019-04-17 DIAGNOSIS — D443 Neoplasm of uncertain behavior of pituitary gland: Secondary | ICD-10-CM | POA: Diagnosis not present

## 2019-04-24 DIAGNOSIS — E2749 Other adrenocortical insufficiency: Secondary | ICD-10-CM | POA: Diagnosis not present

## 2019-04-24 DIAGNOSIS — R251 Tremor, unspecified: Secondary | ICD-10-CM | POA: Diagnosis not present

## 2019-04-24 DIAGNOSIS — E291 Testicular hypofunction: Secondary | ICD-10-CM | POA: Diagnosis not present

## 2019-04-24 DIAGNOSIS — D443 Neoplasm of uncertain behavior of pituitary gland: Secondary | ICD-10-CM | POA: Diagnosis not present

## 2019-04-24 DIAGNOSIS — E038 Other specified hypothyroidism: Secondary | ICD-10-CM | POA: Diagnosis not present

## 2019-06-19 ENCOUNTER — Other Ambulatory Visit: Payer: Self-pay | Admitting: Family Medicine

## 2019-06-19 NOTE — Telephone Encounter (Signed)
Name of Medication: zolpidem 10 mg Name of [facility]acy: [facility] or Written Date and Quantity:# 30 x 2 on 04/01/19  Last Office Visit and Type:01/09/19 annual  Next Office Visit and Type:none scheduled   Pt said had refilled last time on 05/28/19 and wanted to give Dr Damita Dunnings time to refill med so calling a wk early.

## 2019-06-20 NOTE — Telephone Encounter (Signed)
Sent. Thanks.  I put a note on the prescription to fill on/after 06/25/2019.  That should allow me to go ahead and send the prescription now.

## 2019-08-20 DIAGNOSIS — Z23 Encounter for immunization: Secondary | ICD-10-CM | POA: Diagnosis not present

## 2019-09-20 ENCOUNTER — Other Ambulatory Visit: Payer: Self-pay | Admitting: Family Medicine

## 2019-09-22 NOTE — Telephone Encounter (Addendum)
Electronic refill request. Zolpidem Last office visit:   01/13/2019 Last Filled:    30 tablet 2 06/20/2019  Please advise.

## 2019-09-22 NOTE — Telephone Encounter (Signed)
Sent. Thanks.   

## 2019-11-20 ENCOUNTER — Other Ambulatory Visit: Payer: Self-pay | Admitting: Family Medicine

## 2019-11-20 NOTE — Telephone Encounter (Addendum)
Electronic refill request. Triamcinolone Cream 0.1% Last office visit:   01/13/2019 Last Filled:    30 g 1 01/13/2019   Please advise.

## 2019-11-23 NOTE — Telephone Encounter (Signed)
Sent. Thanks.   

## 2019-12-16 ENCOUNTER — Telehealth: Payer: Self-pay | Admitting: Family Medicine

## 2019-12-16 NOTE — Telephone Encounter (Signed)
Electronic refill request. Zolpidem Last office visit:   01/13/2019 Last Filled:    30 tablet 2 09/22/2019  Please advise.

## 2019-12-17 NOTE — Telephone Encounter (Signed)
Sent. Thanks.  Needs CPE versus annual Medicare wellness visit when possible.

## 2019-12-18 NOTE — Telephone Encounter (Signed)
Patient scheduled.

## 2020-01-11 ENCOUNTER — Other Ambulatory Visit: Payer: Self-pay | Admitting: Family Medicine

## 2020-01-11 DIAGNOSIS — E7800 Pure hypercholesterolemia, unspecified: Secondary | ICD-10-CM

## 2020-01-18 ENCOUNTER — Ambulatory Visit: Payer: Medicare HMO | Attending: Internal Medicine

## 2020-01-18 DIAGNOSIS — Z23 Encounter for immunization: Secondary | ICD-10-CM | POA: Insufficient documentation

## 2020-01-18 NOTE — Progress Notes (Signed)
   Covid-19 Vaccination Clinic  Name:  Brian Stokes    MRN: [id number] DOB: Aug 22, 1951  01/18/2020  [name] was observed post Covid-19 immunization for 15 minutes without incidence. He was provided with Vaccine Information Sheet and instruction to access the V-Safe system.   [name] was instructed to call 911 with any severe reactions post vaccine: [place] Kitchen Difficulty breathing  . Swelling of your face and throat  . A fast heartbeat  . A bad rash all over your body  . Dizziness and weakness    Immunizations Administered    Name Date Dose VIS Date Route   Pfizer COVID-19 Vaccine 01/18/2020  8:44 AM 0.3 mL 10/31/2019 Intramuscular   Manufacturer: [place]   Lot: [id number]   [address]: [id number]

## 2020-01-19 ENCOUNTER — Other Ambulatory Visit: Payer: Self-pay

## 2020-01-19 ENCOUNTER — Other Ambulatory Visit (INDEPENDENT_AMBULATORY_CARE_PROVIDER_SITE_OTHER): Payer: Medicare HMO

## 2020-01-19 DIAGNOSIS — E7800 Pure hypercholesterolemia, unspecified: Secondary | ICD-10-CM

## 2020-01-19 LAB — LIPID PANEL
Cholesterol: 146 mg/dL (ref 0–200)
HDL: 28.8 mg/dL — ABNORMAL LOW (ref >39.00)
NonHDL: 116.82
Total CHOL/HDL Ratio: 5
Triglycerides: 227 mg/dL — ABNORMAL HIGH (ref 0.0–149.0)
VLDL: 45.4 mg/dL — ABNORMAL HIGH (ref 0.0–40.0)

## 2020-01-19 LAB — COMPREHENSIVE METABOLIC PANEL
ALT: 48 U/L (ref 0–53)
AST: 29 U/L (ref 0–37)
Albumin: 4 g/dL (ref 3.5–5.2)
Alkaline Phosphatase: 60 U/L (ref 39–117)
BUN: 15 mg/dL (ref 6–23)
CO2: 30 mEq/L (ref 19–32)
Calcium: 9.2 mg/dL (ref 8.4–10.5)
Chloride: 104 mEq/L (ref 96–112)
Creatinine, Ser: 0.96 mg/dL (ref 0.40–1.50)
GFR: 78.11 mL/min (ref >60.00)
Glucose, Bld: 95 mg/dL (ref 70–99)
Potassium: 4.1 mEq/L (ref 3.5–5.1)
Sodium: 141 mEq/L (ref 135–145)
Total Bilirubin: 0.6 mg/dL (ref 0.2–1.2)
Total Protein: 6.2 g/dL (ref 6.0–8.3)

## 2020-01-19 LAB — LDL CHOLESTEROL, DIRECT: Direct LDL: 87 mg/dL

## 2020-01-29 ENCOUNTER — Other Ambulatory Visit: Payer: Self-pay

## 2020-01-29 ENCOUNTER — Encounter: Payer: Self-pay | Admitting: Family Medicine

## 2020-01-29 ENCOUNTER — Ambulatory Visit (INDEPENDENT_AMBULATORY_CARE_PROVIDER_SITE_OTHER): Payer: Medicare HMO | Admitting: Family Medicine

## 2020-01-29 VITALS — BP 150/86 | HR 87 | Temp 97.5°F | Ht 69.0 in | Wt 249.2 lb

## 2020-01-29 DIAGNOSIS — R21 Rash and other nonspecific skin eruption: Secondary | ICD-10-CM | POA: Diagnosis not present

## 2020-01-29 DIAGNOSIS — R251 Tremor, unspecified: Secondary | ICD-10-CM

## 2020-01-29 DIAGNOSIS — Z7189 Other specified counseling: Secondary | ICD-10-CM

## 2020-01-29 DIAGNOSIS — G4700 Insomnia, unspecified: Secondary | ICD-10-CM

## 2020-01-29 DIAGNOSIS — E7800 Pure hypercholesterolemia, unspecified: Secondary | ICD-10-CM

## 2020-01-29 DIAGNOSIS — Z0000 Encounter for general adult medical examination without abnormal findings: Secondary | ICD-10-CM | POA: Diagnosis not present

## 2020-01-29 DIAGNOSIS — E039 Hypothyroidism, unspecified: Secondary | ICD-10-CM

## 2020-01-29 DIAGNOSIS — E230 Hypopituitarism: Secondary | ICD-10-CM

## 2020-01-29 DIAGNOSIS — Z1211 Encounter for screening for malignant neoplasm of colon: Secondary | ICD-10-CM | POA: Diagnosis not present

## 2020-01-29 MED ORDER — PROPRANOLOL HCL 20 MG PO TABS: 20.0000 mg | ORAL_TABLET | Freq: Two times a day (BID) | ORAL | 3 refills | Status: DC

## 2020-01-29 MED ORDER — FLUOCINOLONE ACETONIDE 0.01 % EX CREA: TOPICAL_CREAM | Freq: Two times a day (BID) | CUTANEOUS | 1 refills | Status: DC

## 2020-01-29 MED ORDER — PRAVASTATIN SODIUM 40 MG PO TABS: 40.0000 mg | ORAL_TABLET | Freq: Every day | ORAL | 3 refills | Status: DC

## 2020-01-29 NOTE — Patient Instructions (Addendum)
See about follow up with your other docs after you get your second covid vaccine.   We'll call about getting cologuard set up.    Stop triamcinolone and change to fluocinolone on the rash.  Update me if that doesn't help.  Take care.  Glad to see you.  We'll update Dr. Chalmers Cater.

## 2020-01-29 NOTE — Progress Notes (Signed)
This visit occurred during the SARS-CoV-2 public health emergency.  Safety protocols were in place, including screening questions prior to the visit, additional usage of staff PPE, and extensive cleaning of exam room while observing appropriate contact time as indicated for disinfecting solutions.  He had 1st covid vaccine.  D/w pt.  2nd dose pending.   Flu 2020 PNA 2020 Tetanus 2013 Shingles d/w pt. Defer for now.   D/w patient [id number] for colon cancer screening, including IFOB vs. colonoscopy.  Risks and benefits of both were discussed and patient voiced understanding.  Pt elects [id number].   Prostate cancer screening per endo.   Advance directive d/w pt. Youngest brother Landin Engquist designated if patient were incapacitated.  Panhypopit hx d/w pt.  No new sx.  He had been on steroids due to a cough, prior to having his labs done.  His cough is better.  No recent decadron use.  Hypothyroidism- per endocrine, I"ll defer.  Still on T replacement per endocrine.     Elevated Cholesterol: Using medications without problems: yes Muscle aches: no Diet compliance: d/w pt.  Exercise: d/w pt.   Diet and exercise have been altered with covid cautions.  Insomnia.  Taking ambien at baseline.  No ADE on med. Sleeping better with med, "my sleep is good."  He is working on sleep hygiene.  D/w pt.    Skin lesion.  On R thigh.  Persistent.  Not changing.  It got lighter when patient was on higher dose of oral steroids.  TAC cream didn't help.    Termor is some better with propranolol.  No ADE on med.  Compliant.    PMH and SH reviewed  ROS: Per HPI unless specifically indicated in ROS section   Meds, vitals, and allergies reviewed.   GEN: nad, alert and oriented HEENT: ncat NECK: supple w/o LA CV: rrr PULM: ctab, no inc wob ABD: soft, +bs EXT: no edema SKIN: no acute rash but chronic 4x2cm psoriatic area on the R medial thigh.

## 2020-02-01 DIAGNOSIS — Z0000 Encounter for general adult medical examination without abnormal findings: Secondary | ICD-10-CM | POA: Insufficient documentation

## 2020-02-01 NOTE — Assessment & Plan Note (Signed)
Diet and exercise have been altered with covid cautions.  Continue pravastatin.  He will work on diet and exercise as best he can.

## 2020-02-01 NOTE — Assessment & Plan Note (Signed)
Advance directive d/w pt. Youngest brother [place] designated if patient were incapacitated.

## 2020-02-01 NOTE — Assessment & Plan Note (Signed)
Per endocrine.  We will update the endocrine clinic about his recent labs.  No changes meds at this point.  He has not had to use Decadron recently.

## 2020-02-01 NOTE — Assessment & Plan Note (Signed)
Looks like he has not had adequate response to topical steroid treatment for psoriasis.  Okay to try fluocinolone and update me as needed.  I do not expect him to have no systemic absorption to interfere with his other medications.

## 2020-02-01 NOTE — Assessment & Plan Note (Signed)
He had 1st covid vaccine.  D/w pt.  2nd dose pending.   Flu 2020 PNA 2020 Tetanus 2013 Shingles d/w pt. Defer for now.   D/w patient [id number] for colon cancer screening, including IFOB vs. colonoscopy.  Risks and benefits of both were discussed and patient voiced understanding.  Pt elects [id number].   Prostate cancer screening per endo.   Advance directive d/w pt. Youngest brother Elijiah Altomari designated if patient were incapacitated.

## 2020-02-01 NOTE — Assessment & Plan Note (Signed)
Per endocrinology, I will defer.  He agrees.

## 2020-02-01 NOTE — Assessment & Plan Note (Signed)
Taking ambien at baseline.  No ADE on med. Sleeping better with med, "my sleep is good."  He is working on sleep hygiene.  D/w pt.   continue Ambien as needed.

## 2020-02-01 NOTE — Assessment & Plan Note (Signed)
Symptomatically improved with propranolol.  Continue as is.  He agrees.

## 2020-02-11 ENCOUNTER — Ambulatory Visit: Payer: Medicare HMO | Attending: Internal Medicine

## 2020-02-11 DIAGNOSIS — Z23 Encounter for immunization: Secondary | ICD-10-CM

## 2020-02-11 NOTE — Progress Notes (Signed)
   Covid-19 Vaccination Clinic  Name:  EDITH KELDERMAN    MRN: [id number] DOB: [birth year]  02/11/2020  Mr. Kulas was observed post Covid-19 immunization for 15 minutes without incident. He was provided with Vaccine Information Sheet and instruction to access the V-Safe system.   Mr. Dyar was instructed to call 911 with any severe reactions post vaccine: Marland Kitchen Difficulty breathing  . Swelling of face and throat  . A fast heartbeat  . A bad rash all over body  . Dizziness and weakness   Immunizations Administered    Name Date Dose VIS Date Route   Pfizer COVID-19 Vaccine 02/11/2020  9:29 AM 0.3 mL 10/31/2019 Intramuscular   Manufacturer: [place]   Lot: [id number]   [place]: [id number]

## 2020-03-16 ENCOUNTER — Other Ambulatory Visit: Payer: Self-pay | Admitting: Family Medicine

## 2020-03-16 NOTE — Telephone Encounter (Signed)
Electronic refill request. Zolpidem Last office visit:   01/29/2020 Last Filled:    30 tablet 2 12/17/2019  Please advise.

## 2020-03-18 NOTE — Telephone Encounter (Signed)
Sent. Thanks.   

## 2020-04-13 ENCOUNTER — Ambulatory Visit (INDEPENDENT_AMBULATORY_CARE_PROVIDER_SITE_OTHER): Payer: Medicare HMO

## 2020-04-13 DIAGNOSIS — Z0000 Encounter for general adult medical examination without abnormal findings: Secondary | ICD-10-CM | POA: Diagnosis not present

## 2020-04-13 NOTE — Patient Instructions (Signed)
Brian Stokes , Thank you for taking time to come for your Medicare Wellness Visit. I appreciate your ongoing commitment to your health goals. Please review the following plan we discussed and let me know if I can assist you in the future.   Screening recommendations/referrals: Colonoscopy: has cologuard kit to complete Recommended yearly ophthalmology/optometry visit for glaucoma screening and checkup Recommended yearly dental visit for hygiene and checkup  Vaccinations: Influenza vaccine: Up to date, completed 08/20/2019 Pneumococcal vaccine: due Tdap vaccine: Up to date, completed 09/17/2012 Shingles vaccine: discussed    Advanced directives: Advance directive discussed with you today. Even though you declined this today please call our office should you change your mind and we can give you the proper paperwork for you to fill out.   Conditions/risks identified: hypercholesterolemia  Next appointment: none  Preventive Care 67 Years and Older, Male Preventive care refers to lifestyle choices and visits with your health care provider that can promote health and wellness. What does preventive care include?  A yearly physical exam. This is also called an annual well check.  Dental exams once or twice a year.  Routine eye exams. Ask your health care provider how often you should have your eyes checked.  Personal lifestyle choices, including:  Daily care of your teeth and gums.  Regular physical activity.  Eating a healthy diet.  Avoiding tobacco and drug use.  Limiting alcohol use.  Practicing safe sex.  Taking low doses of aspirin every day.  Taking vitamin and mineral supplements as recommended by your health care provider. What happens during an annual well check? The services and screenings done by your health care provider during your annual well check will depend on your age, overall health, lifestyle risk factors, and family history of disease. Counseling  Your  health care provider may ask you questions about your:  Alcohol use.  Tobacco use.  Drug use.  Emotional well-being.  Home and relationship well-being.  Sexual activity.  Eating habits.  History of falls.  Memory and ability to understand (cognition).  Work and work Statistician. Screening  You may have the following tests or measurements:  Height, weight, and BMI.  Blood pressure.  Lipid and cholesterol levels. These may be checked every 5 years, or more frequently if you are over 76 years old.  Skin check.  Lung cancer screening. You may have this screening every year starting at age 67 if you have a 30-pack-year history of smoking and currently smoke or have quit within the past 15 years.  Fecal occult blood test (FOBT) of the stool. You may have this test every year starting at age 20.  Flexible sigmoidoscopy or colonoscopy. You may have a sigmoidoscopy every 5 years or a colonoscopy every 10 years starting at age 74.  Prostate cancer screening. Recommendations will vary depending on your family history and other risks.  Hepatitis C blood test.  Hepatitis B blood test.  Sexually transmitted disease (STD) testing.  Diabetes screening. This is done by checking your blood sugar (glucose) after you have not eaten for a while (fasting). You may have this done every 1-3 years.  Abdominal aortic aneurysm (AAA) screening. You may need this if you are a current or former smoker.  Osteoporosis. You may be screened starting at age 40 if you are at high risk. Talk with your health care provider about your test results, treatment options, and if necessary, the need for more tests. Vaccines  Your health care provider may recommend certain vaccines,  such as:  Influenza vaccine. This is recommended every year.  Tetanus, diphtheria, and acellular pertussis (Tdap, Td) vaccine. You may need a Td booster every 10 years.  Zoster vaccine. You may need this after age 59.   Pneumococcal 13-valent conjugate (PCV13) vaccine. One dose is recommended after age 5.  Pneumococcal polysaccharide (PPSV23) vaccine. One dose is recommended after age 74. Talk to your health care provider about which screenings and vaccines you need and how often you need them. This information is not intended to replace advice given to you by your health care provider. Make sure you discuss any questions you have with your health care provider. Document Released: 12/03/2015 Document Revised: 07/26/2016 Document Reviewed: 09/07/2015 Elsevier Interactive Patient Education  2017 [place] Prevention in the Home Falls can cause injuries. They can happen to people of all ages. There are many things you can do to make your home safe and to help prevent falls. What can I do on the outside of my home?  Regularly fix the edges of walkways and driveways and fix any cracks.  Remove anything that might make you trip as you walk through a door, such as a raised step or threshold.  Trim any bushes or trees on the path to your home.  Use bright outdoor lighting.  Clear any walking paths of anything that might make someone trip, such as rocks or tools.  Regularly check to see if handrails are loose or broken. Make sure that both sides of any steps have handrails.  Any raised decks and porches should have guardrails on the edges.  Have any leaves, snow, or ice cleared regularly.  Use sand or salt on walking paths during winter.  Clean up any spills in your garage right away. This includes oil or grease spills. What can I do in the bathroom?  Use night lights.  Install grab bars by the toilet and in the tub and shower. Do not use towel bars as grab bars.  Use non-skid mats or decals in the tub or shower.  If you need to sit down in the shower, use a plastic, non-slip stool.  Keep the floor dry. Clean up any water that spills on the floor as soon as it happens.  Remove soap  buildup in the tub or shower regularly.  Attach bath mats securely with double-sided non-slip rug tape.  Do not have throw rugs and other things on the floor that can make you trip. What can I do in the bedroom?  Use night lights.  Make sure that you have a light by your bed that is easy to reach.  Do not use any sheets or blankets that are too big for your bed. They should not hang down onto the floor.  Have a firm chair that has side arms. You can use this for support while you get dressed.  Do not have throw rugs and other things on the floor that can make you trip. What can I do in the kitchen?  Clean up any spills right away.  Avoid walking on wet floors.  Keep items that you use a lot in easy-to-reach places.  If you need to reach something above you, use a strong step stool that has a grab bar.  Keep electrical cords out of the way.  Do not use floor polish or wax that makes floors slippery. If you must use wax, use non-skid floor wax.  Do not have throw rugs and other things on  the floor that can make you trip. What can I do with my stairs?  Do not leave any items on the stairs.  Make sure that there are handrails on both sides of the stairs and use them. Fix handrails that are broken or loose. Make sure that handrails are as long as the stairways.  Check any carpeting to make sure that it is firmly attached to the stairs. Fix any carpet that is loose or worn.  Avoid having throw rugs at the top or bottom of the stairs. If you do have throw rugs, attach them to the floor with carpet tape.  Make sure that you have a light switch at the top of the stairs and the bottom of the stairs. If you do not have them, ask someone to add them for you. What else can I do to help prevent falls?  Wear shoes that:  Do not have high heels.  Have rubber bottoms.  Are comfortable and fit you well.  Are closed at the toe. Do not wear sandals.  If you use a stepladder:  Make  sure that it is fully opened. Do not climb a closed stepladder.  Make sure that both sides of the stepladder are locked into place.  Ask someone to hold it for you, if possible.  Clearly mark and make sure that you can see:  Any grab bars or handrails.  First and last steps.  Where the edge of each step is.  Use tools that help you move around (mobility aids) if they are needed. These include:  Canes.  Walkers.  Scooters.  Crutches.  Turn on the lights when you go into a dark area. Replace any light bulbs as soon as they burn out.  Set up your furniture so you have a clear path. Avoid moving your furniture around.  If any of your floors are uneven, fix them.  If there are any pets around you, be aware of where they are.  Review your medicines with your doctor. Some medicines can make you feel dizzy. This can increase your chance of falling. Ask your doctor what other things that you can do to help prevent falls. This information is not intended to replace advice given to you by your health care provider. Make sure you discuss any questions you have with your health care provider. Document Released: 09/02/2009 Document Revised: 04/13/2016 Document Reviewed: 12/11/2014 Elsevier Interactive Patient Education  2017 [employer].

## 2020-04-13 NOTE — Progress Notes (Signed)
Subjective:   Brian Stokes is a 69 y.o. male who presents for an Initial Medicare Annual Wellness Visit.  Review of Systems: N/A    I connected with the patient today by telephone and verified that I am speaking with the correct person using two identifiers. Location patient: home Location nurse: work Persons participating in the virtual visit: patient, Marine scientist.   I discussed the limitations, risks, security and privacy concerns of performing an evaluation and management service by telephone and the availability of in person appointments. I also discussed with the patient that there may be a patient responsible charge related to this service. The patient expressed understanding and verbally consented to this telephonic visit.    Interactive audio and video telecommunications were attempted between this nurse and patient, however failed, due to patient having technical difficulties OR patient did not have access to video capability.  We continued and completed visit with audio only.     Cardiac Risk Factors include: advanced age (>15mn, >>61women);Other (see comment), Risk factor comments: hypercholesterolemia    Objective:    Today's Vitals   There is no height or weight on file to calculate BMI.  Advanced Directives 04/13/2020 11/20/2013  Does Patient Have a Medical Advance Directive? No Patient does not have advance directive  Would patient like information on creating a medical advance directive? No - Patient declined -    Current Medications (verified) Outpatient Encounter Medications as of 04/13/2020  Medication Sig  . albuterol (PROVENTIL HFA;VENTOLIN HFA) 108 (90 BASE) MCG/ACT inhaler Inhale 1-2 puffs into the lungs every 6 (six) hours as needed for wheezing or shortness of breath.  .Marland Kitchenaspirin 81 MG tablet Take 81 mg by mouth daily.  . bromocriptine (PARLODEL) 2.5 MG tablet Take 2.5 mg by mouth daily.   . calcium-vitamin D (OSCAL WITH D) 500-200 MG-UNIT per tablet Take 1  tablet by mouth 3 (three) times daily.  . cyclobenzaprine (FLEXERIL) 10 MG tablet Take 1 tablet (10 mg total) by mouth 3 (three) times daily as needed for muscle spasms.  .Marland Kitchendexamethasone (DECADRON) 1 MG/ML solution Take 1 mL in emergency  . fluocinolone (VANOS) 0.01 % cream Apply topically 2 (two) times daily. Use a small amount on affected area.  . hydrocortisone (CORTEF) 10 MG tablet Take 20 mg by mouth in the morning and 10 mg by mouth at night (total 30 mg daily).  Double the daily dose if stressed.  .Marland KitchenLYRICA 75 MG capsule Take 75 mg by mouth 2 (two) times daily.   . Multiple Vitamin (MULTIVITAMIN) tablet Take 1 tablet by mouth daily.  . pravastatin (PRAVACHOL) 40 MG tablet Take 1 tablet (40 mg total) by mouth daily.  . propranolol (INDERAL) 20 MG tablet Take 1 tablet (20 mg total) by mouth 2 (two) times daily.  .Marland KitchenSYNTHROID 88 MCG tablet Take 88 mcg by mouth daily.   .Marland Kitchentestosterone cypionate (DEPOTESTOTERONE CYPIONATE) 100 MG/ML injection Inject 10 mg into the muscle. For IM use only Every 5 days  . zolpidem (AMBIEN) 10 MG tablet TAKE 1 TABLET BY MOUTH EVERY DAY AT BEDTIME AS NEEDED FOR SLEEP   No facility-administered encounter medications on file as of 04/13/2020.    Allergies (verified) Patient has no known allergies.   History: Past Medical History:  Diagnosis Date  . Abnormal involuntary movements(781.0)   . Anosmia   . Asthma   . Complication of anesthesia   . [place]    Dr. [place]arloyn Manner . Disturbances of sensation  of smell and taste   . Encounter for long-term (current) use of other medications   . 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 Headache(784.0)    Frequent  . Hypogonadism male   . 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 Nephrolithiasis   . Osteopenia   . Other testicular hypofunction   . Panhypopituitarism ([place])   . PONV (postoperative nausea and vomiting)   . Pure hypercholesterolemia   . Routine general  medical examination at a health care facility   . Sciatica   . Special screening for malignant neoplasms, colon   . Tremor   . Unspecified hypothyroidism    Past Surgical History:  Procedure Laterality Date  . CRANIOTOMY     X 3 - Dr. Carloyn Manner  . Gamma Knife  2007  . Left middle finger mallet injury     Family History  Problem Relation Age of Onset  . Cancer Other        Leukemia.  Aunt Adopted him  . Leukemia Other        aunt was adoptive mother  . Breast cancer Cousin   . Colon cancer Maternal Aunt   . Prostate cancer Maternal Grandfather    Social History   Socioeconomic History  . Marital status: Single    Spouse name: Not on file  . Number of children: Not on file  . Years of education: Not on file  . Highest education level: Not on file  Occupational History  . Occupation: Training and development officer  Tobacco Use  . Smoking status: Former Research scientist (life sciences)  . Smokeless tobacco: Never Used  . Tobacco comment: QUIT SMOKING MANY YEARS AGO "  Substance and Sexual Activity  . Alcohol use: Yes    Comment: rare  . Drug use: No  . Sexual activity: Not on file  Other Topics Concern  . Not on file  Social History Narrative   [employer] grad, music major (bassoon)   Social Determinants of Health   Financial Resource Strain: Low Risk   . Difficulty of Paying Living Expenses: Not hard at all  Food Insecurity: No Food Insecurity  . Worried About Charity fundraiser in the Last Year: Never true  . Ran Out of Food in the Last Year: Never true  Transportation Needs: No Transportation Needs  . Lack of Transportation (Medical): No  . Lack of Transportation (Non-Medical): No  Physical Activity: Inactive  . Days of Exercise per Week: 0 days  . Minutes of Exercise per Session: 0 min  Stress: Stress Concern Present  . Feeling of Stress : To some extent  Social Connections:   . Frequency of Communication with Friends and Family:   . Frequency of Social Gatherings with Friends and Family:   . Attends  Religious Services:   . Active Member of Clubs or Organizations:   . Attends Archivist Meetings:   Marland Kitchen Marital Status:    Tobacco Counseling Counseling given: Not Answered Comment: QUIT SMOKING MANY YEARS AGO "   Clinical Intake:  Pre-visit preparation completed: Yes  Pain : No/denies pain     Nutritional Risks: None Diabetes: No  How often do you need to have someone help you when you read instructions, pamphlets, or other written materials from your doctor or pharmacy?: 1 - Never  Interpreter Needed?: No  Information entered by :: CJohnson, LPN  Activities of Daily Living In your present state of health, do you have any difficulty performing the following  activities: 04/13/2020  Hearing? N  Vision? N  Difficulty concentrating or making decisions? N  Walking or climbing stairs? N  Dressing or bathing? N  Doing errands, shopping? N  Preparing Food and eating ? N  Using the Toilet? N  In the past six months, have you accidently leaked urine? N  Do you have problems with loss of bowel control? N  Managing your Medications? N  Managing your Finances? N  Housekeeping or managing your Housekeeping? N  Some recent data might be hidden     Immunizations and Health Maintenance Immunization History  Administered Date(s) Administered  . Influenza Split 09/17/2012  . Influenza Whole 08/20/2010  . Influenza, High Dose Seasonal PF 08/21/2016, 11/05/2017  . Influenza-Unspecified 10/20/2014, 08/20/2018  . PFIZER SARS-COV-2 Vaccination 01/18/2020, 02/11/2020  . Pneumococcal Polysaccharide-23 11/20/2009, 01/13/2019   Health Maintenance Due  Topic Date Due  . COLONOSCOPY  Never done  . PNA vac Low Risk Adult (2 of 2 - PCV13) 01/14/2020    Patient Care Team: Tonia Ghent, MD as PCP - General (Family Medicine) Jacelyn Pi, MD as Consulting Physician (Endocrinology)  Indicate any recent Medical Services you may have received from other than [facility] providers  in the past year (date may be approximate).    Assessment:   This is a routine wellness examination for Brian Stokes.  Hearing/Vision screen  Hearing Screening   _0  _1  _2  _3  _4  _5  _6  _7  _8   Right ear:           Left ear:           Vision Screening Comments: Advised patient to get annual eye exams   Dietary issues and exercise activities discussed: Current Exercise Habits: The patient does not participate in regular exercise at present, Exercise limited by: None identified  Goals    . Patient Stated     04/13/2020, I will maintain and continue medications as prescribed.       Depression Screen PHQ 2/9 Scores 04/13/2020 11/05/2017  PHQ - 2 Score 0 0  PHQ- 9 Score 0 -    Fall Risk Fall Risk  04/13/2020  Falls in the past year? 0  Number falls in past yr: 0  Injury with Fall? 0  Risk for fall due to : Medication side effect;Impaired balance/gait  Follow up Falls evaluation completed;Falls prevention discussed    Is the patient's home free of loose throw rugs in walkways, pet beds, electrical cords, etc?   yes      Grab bars in the bathroom? yes      Handrails on the stairs?   yes      Adequate lighting?   yes  Timed Get Up and Go performed: N/A  Cognitive Function: MMSE - Mini Mental State Exam 04/13/2020  Orientation to time 5  Orientation to Place 5  Registration 3  Attention/ Calculation 5  Recall 3  Language- repeat 1       Mini Cog  Mini-Cog screen was completed. Maximum score is 22. A value of 0 denotes this part of the MMSE was not completed or the patient failed this part of the Mini-Cog screening.  Screening Tests Health Maintenance  Topic Date Due  . COLONOSCOPY  Never done  . PNA vac Low Risk Adult (2 of 2 - PCV13) 01/14/2020  . INFLUENZA VACCINE  06/20/2020  . TETANUS/TDAP  09/17/2022  . COVID-19 Vaccine  Completed  . Hepatitis C Screening  Completed    Qualifies for Shingles Vaccine: yes  Cancer Screenings: Lung:  Low Dose CT Chest recommended if Age 78-80 years, 30 pack-year currently smoking OR have quit w/in 15 years. Patient does not qualify. Colorectal: Has cologuard kit to complete  Additional Screenings:  Hepatitis C Screening: 08/21/2016      Plan:    Patient will maintain and continue medications as prescribed.   I have personally reviewed and noted the following in the patient's chart:   . Medical and social history . Use of alcohol, tobacco or illicit drugs  . Current medications and supplements . Functional ability and status . Nutritional status . Physical activity . Advanced directives . List of other physicians . Hospitalizations, surgeries, and ER visits in previous 12 months . Vitals . Screenings to include cognitive, depression, and falls . Referrals and appointments  In addition, I have reviewed and discussed with patient certain preventive protocols, quality metrics, and best practice recommendations. A written personalized care plan for preventive services as well as general preventive health recommendations were provided to patient.     Andrez Grime, LPN   12/16/5168

## 2020-04-13 NOTE — Progress Notes (Signed)
PCP notes:  Health Maintenance: Colonoscopy- Patient will complete cologuard kit soon.   Prevnar 13- due    Abnormal Screenings: none   Patient concerns: none   Nurse concerns: none   Next PCP appt.: none

## 2020-04-15 DIAGNOSIS — D443 Neoplasm of uncertain behavior of pituitary gland: Secondary | ICD-10-CM | POA: Diagnosis not present

## 2020-04-15 DIAGNOSIS — E291 Testicular hypofunction: Secondary | ICD-10-CM | POA: Diagnosis not present

## 2020-04-22 ENCOUNTER — Other Ambulatory Visit: Payer: Self-pay | Admitting: Endocrinology

## 2020-04-22 DIAGNOSIS — E291 Testicular hypofunction: Secondary | ICD-10-CM | POA: Diagnosis not present

## 2020-04-22 DIAGNOSIS — D443 Neoplasm of uncertain behavior of pituitary gland: Secondary | ICD-10-CM

## 2020-04-22 DIAGNOSIS — E2749 Other adrenocortical insufficiency: Secondary | ICD-10-CM | POA: Diagnosis not present

## 2020-04-22 DIAGNOSIS — Z7952 Long term (current) use of systemic steroids: Secondary | ICD-10-CM | POA: Diagnosis not present

## 2020-04-22 DIAGNOSIS — R251 Tremor, unspecified: Secondary | ICD-10-CM | POA: Diagnosis not present

## 2020-04-22 DIAGNOSIS — E038 Other specified hypothyroidism: Secondary | ICD-10-CM | POA: Diagnosis not present

## 2020-05-03 ENCOUNTER — Encounter: Payer: Self-pay | Admitting: Family Medicine

## 2020-05-03 DIAGNOSIS — Z1211 Encounter for screening for malignant neoplasm of colon: Secondary | ICD-10-CM | POA: Diagnosis not present

## 2020-05-03 LAB — COLOGUARD: Cologuard: NEGATIVE

## 2020-05-09 LAB — COLOGUARD: Cologuard: NEGATIVE

## 2020-05-18 ENCOUNTER — Encounter: Payer: Self-pay | Admitting: Family Medicine

## 2020-05-27 DIAGNOSIS — Z7952 Long term (current) use of systemic steroids: Secondary | ICD-10-CM | POA: Diagnosis not present

## 2020-06-07 ENCOUNTER — Other Ambulatory Visit: Payer: Self-pay | Admitting: Family Medicine

## 2020-06-07 NOTE — Telephone Encounter (Signed)
Electronic refill request. Fluocinolone 0.01% Cream Last office visit:   01/29/2020 Last Filled:     30 g 1 01/29/2020  Please advise.

## 2020-06-08 NOTE — Telephone Encounter (Signed)
Sent. Thanks.   

## 2020-06-12 ENCOUNTER — Other Ambulatory Visit: Payer: Self-pay | Admitting: Family Medicine

## 2020-06-14 NOTE — Telephone Encounter (Signed)
Electronic refill request. Zolpidem Last office visit:   01/29/2020 Last Filled:    30 tablet 2 03/18/2020  Please advise.

## 2020-06-15 NOTE — Telephone Encounter (Signed)
Sent. Thanks.   

## 2020-07-20 ENCOUNTER — Ambulatory Visit: Payer: Medicare HMO | Attending: Internal Medicine

## 2020-07-20 DIAGNOSIS — Z23 Encounter for immunization: Secondary | ICD-10-CM

## 2020-07-20 NOTE — Progress Notes (Signed)
   Covid-19 Vaccination Clinic  Name:  Brian Stokes    MRN: [id number] DOB: 05-Sep-1951  07/20/2020  [name] was observed post Covid-19 immunization for 15 minutes without incident. He was provided with Vaccine Information Sheet and instruction to access the V-Safe system.   [name] was instructed to call 911 with any severe reactions post vaccine: Marland Kitchen Difficulty breathing  . Swelling of face and throat  . A fast heartbeat  . A bad rash all over body  . Dizziness and weakness

## 2020-08-30 DIAGNOSIS — R69 Illness, unspecified: Secondary | ICD-10-CM | POA: Diagnosis not present

## 2020-12-06 ENCOUNTER — Other Ambulatory Visit: Payer: Self-pay | Admitting: Family Medicine

## 2020-12-08 NOTE — Telephone Encounter (Signed)
Sent.  Please schedule yearly visit this spring when possible.  Thanks.

## 2020-12-08 NOTE — Telephone Encounter (Signed)
Pharmacy requests refill on: Fluocinolone 0.01 %   LAST REFILL: 06/08/2020 (Q-30 g, R-1) LAST OV: 01/29/2020 NEXT OV: Not Scheduled  PHARMACY: [facility], [place] requests refill on: Zolpidem 10 mg   LAST REFILL: 06/15/2020 (Q-30, R-5) LAST OV: 01/29/2020 NEXT OV: Not Scheduled  PHARMACY: [facility] [place], [place]

## 2020-12-20 ENCOUNTER — Telehealth: Payer: Self-pay | Admitting: *Deleted

## 2020-12-20 NOTE — Telephone Encounter (Signed)
PA was paperwork was filled out and signed by Dr. Damita Dunnings over the weekend. Faxed PA to [employer] and waiting on response. Called patient and LM on VM that PA is in process.

## 2020-12-20 NOTE — Telephone Encounter (Signed)
Patient left a voicemail stating that he is needing a PA on his Zolpidem. Patient stated that the PA is up in the air and would like a call back to discuss this.  Has any paperwork been received from the pharmacy on this?

## 2020-12-22 NOTE — Telephone Encounter (Signed)
Received faxed approval from [employer] for zolpidem tartrate tabs. Approved from 11/20/20 to 11/19/2021.  I called and left patient a VM that medication was approved.

## 2021-02-14 ENCOUNTER — Other Ambulatory Visit: Payer: Self-pay | Admitting: Family Medicine

## 2021-02-20 ENCOUNTER — Other Ambulatory Visit: Payer: Self-pay | Admitting: Family Medicine

## 2021-02-20 DIAGNOSIS — E7800 Pure hypercholesterolemia, unspecified: Secondary | ICD-10-CM

## 2021-02-24 ENCOUNTER — Other Ambulatory Visit: Payer: Medicare HMO

## 2021-03-03 ENCOUNTER — Encounter: Payer: Medicare HMO | Admitting: Family Medicine

## 2021-03-08 ENCOUNTER — Other Ambulatory Visit (INDEPENDENT_AMBULATORY_CARE_PROVIDER_SITE_OTHER): Payer: Medicare HMO

## 2021-03-08 ENCOUNTER — Other Ambulatory Visit: Payer: Self-pay

## 2021-03-08 DIAGNOSIS — E7800 Pure hypercholesterolemia, unspecified: Secondary | ICD-10-CM

## 2021-03-08 LAB — COMPREHENSIVE METABOLIC PANEL
ALT: 41 U/L (ref 0–53)
AST: 26 U/L (ref 0–37)
Albumin: 3.8 g/dL (ref 3.5–5.2)
Alkaline Phosphatase: 49 U/L (ref 39–117)
BUN: 14 mg/dL (ref 6–23)
CO2: 31 mEq/L (ref 19–32)
Calcium: 9.1 mg/dL (ref 8.4–10.5)
Chloride: 104 mEq/L (ref 96–112)
Creatinine, Ser: 0.95 mg/dL (ref 0.40–1.50)
GFR: 82.43 mL/min (ref >60.00)
Glucose, Bld: 107 mg/dL — ABNORMAL HIGH (ref 70–99)
Potassium: 4 mEq/L (ref 3.5–5.1)
Sodium: 141 mEq/L (ref 135–145)
Total Bilirubin: 0.8 mg/dL (ref 0.2–1.2)
Total Protein: 6.2 g/dL (ref 6.0–8.3)

## 2021-03-08 LAB — LIPID PANEL
Cholesterol: 126 mg/dL (ref 0–200)
HDL: 25.9 mg/dL — ABNORMAL LOW (ref >39.00)
NonHDL: 100.12
Total CHOL/HDL Ratio: 5
Triglycerides: 235 mg/dL — ABNORMAL HIGH (ref 0.0–149.0)
VLDL: 47 mg/dL — ABNORMAL HIGH (ref 0.0–40.0)

## 2021-03-08 LAB — LDL CHOLESTEROL, DIRECT: Direct LDL: 76 mg/dL

## 2021-03-16 ENCOUNTER — Telehealth: Payer: Self-pay | Admitting: Family Medicine

## 2021-03-16 MED ORDER — PRAVASTATIN SODIUM 40 MG PO TABS: 40.0000 mg | ORAL_TABLET | Freq: Every day | ORAL | 3 refills | Status: AC

## 2021-03-16 NOTE — Telephone Encounter (Signed)
Rx has been sent  

## 2021-03-16 NOTE — Telephone Encounter (Signed)
  LAST APPOINTMENT DATE: 02/14/2021   NEXT APPOINTMENT DATE:@5 /01/2021  MEDICATION:pravatatin 40mg   [facility]  [address] [phone]  Let patient know to contact pharmacy at the end of the day to make sure medication is ready.  Please notify patient to allow 48-72 hours to process  Encourage patient to contact the pharmacy for refills or they can request refills through [place]:   LAST REFILL:  QTY:  REFILL DATE:    OTHER COMMENTS:    Okay for refill?  Please advise

## 2021-03-17 ENCOUNTER — Encounter: Payer: Medicare HMO | Admitting: Family Medicine

## 2021-03-22 ENCOUNTER — Ambulatory Visit (INDEPENDENT_AMBULATORY_CARE_PROVIDER_SITE_OTHER): Payer: Medicare HMO | Admitting: Family Medicine

## 2021-03-22 ENCOUNTER — Other Ambulatory Visit: Payer: Self-pay

## 2021-03-22 ENCOUNTER — Ambulatory Visit (INDEPENDENT_AMBULATORY_CARE_PROVIDER_SITE_OTHER)
Admission: RE | Admit: 2021-03-22 | Discharge: 2021-03-22 | Disposition: A | Discharge status: Home / Self Care | Payer: Medicare HMO | Source: Ambulatory Visit | Attending: Family Medicine | Admitting: Family Medicine

## 2021-03-22 ENCOUNTER — Encounter: Payer: Self-pay | Admitting: Family Medicine

## 2021-03-22 VITALS — BP 142/80 | HR 89 | Temp 98.2°F | Ht 69.0 in | Wt 270.0 lb

## 2021-03-22 DIAGNOSIS — Z0000 Encounter for general adult medical examination without abnormal findings: Secondary | ICD-10-CM | POA: Diagnosis not present

## 2021-03-22 DIAGNOSIS — R059 Cough, unspecified: Secondary | ICD-10-CM | POA: Diagnosis not present

## 2021-03-22 DIAGNOSIS — E230 Hypopituitarism: Secondary | ICD-10-CM

## 2021-03-22 DIAGNOSIS — Z7189 Other specified counseling: Secondary | ICD-10-CM

## 2021-03-22 DIAGNOSIS — G4700 Insomnia, unspecified: Secondary | ICD-10-CM

## 2021-03-22 MED ORDER — ALBUTEROL SULFATE HFA 108 (90 BASE) MCG/ACT IN AERS: 2.0000 | INHALATION_SPRAY | Freq: Four times a day (QID) | RESPIRATORY_TRACT | 3 refills | Status: DC | PRN

## 2021-03-22 MED ORDER — DOXYCYCLINE HYCLATE 100 MG PO TABS: 100.0000 mg | ORAL_TABLET | Freq: Two times a day (BID) | ORAL | 0 refills | Status: AC

## 2021-03-22 NOTE — Assessment & Plan Note (Signed)
Advance directive d/w pt. Brother Khalifa Knecht designated if patient were incapacitated.

## 2021-03-22 NOTE — Patient Instructions (Addendum)
Please have Dr. Chalmers Cater send her note.  Ask her about your pneumonia shot.    Go to the lab on the way out.   If you have mychart we'll likely use that to update you.    If the inhalers doesn't take care of the cough/sputum, then start doxycycline.   Take care.  Glad to see you.

## 2021-03-22 NOTE — Progress Notes (Signed)
This visit occurred during the SARS-CoV-2 public health emergency.  Safety protocols were in place, including screening questions prior to the visit, additional usage of staff PPE, and extensive cleaning of exam room while observing appropriate contact time as indicated for disinfecting solutions.  I have personally reviewed the Medicare Annual Wellness questionnaire and have noted 1. The patient's medical and social history 2. Their use of alcohol, tobacco or illicit drugs 3. Their current medications and supplements 4. The patient's functional ability including ADL's, fall risks, home safety risks and hearing or visual             impairment. 5. Diet and physical activities 6. Evidence for depression or mood disorders  The patients weight, height, BMI have been recorded in the chart and visual acuity is per eye clinic.  I have made referrals, counseling and provided education to the patient based review of the above and I have provided the pt with a written personalized care plan for preventive services.  Provider list updated- see scanned forms.  Routine anticipatory guidance given to patient.  See health maintenance. The possibility exists that previously documented standard health maintenance information may have been brought forward from a previous encounter into this note.  If needed, that same information has been updated to reflect the current situation based on today's encounter.    Flu 2021 Shingles discussed with patient.  Would defer given recent illness. PNA, discussed with patient.  Would defer given recent illness. Tetanus discussed with patient. COVID-vaccine previously done. Cologuard neg 2021 Prostate cancer screening per endo given T replacement.   Advance directive d/w pt. Brother Kalep Full designated if patient were incapacitated.   Cognitive function addressed- see scanned forms- and if abnormal then additional documentation follows.   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  He has good sleep hygiene.    Cough. Had covid vaccine.  Going on episodically since November, ie months. Dec in sputum overall but still yellowish.  Voice has been irritated, from coughing.  Taking expectorant, drinking water.  No fevers.  No recent albuterol use.  On hydrocortisone at baseline, higher dose recently.    Panhypopituitary d/w pt, per endo.    PMH and SH reviewed  Meds, vitals, and allergies reviewed.   ROS: Per HPI.  Unless specifically indicated otherwise in HPI, the patient denies:  General: fever. Eyes: acute vision changes ENT: sore throat Cardiovascular: chest pain Respiratory: SOB GI: vomiting GU: dysuria Musculoskeletal: acute back pain Derm: acute rash Neuro: acute motor dysfunction Psych: worsening mood Endocrine: polydipsia Heme: bleeding Allergy: hayfever  GEN: nad, alert and oriented HEENT: ncat NECK: supple w/o LA CV: rrr. PULM: ctab, no inc wob ABD: soft, +bs EXT: no edema SKIN: no acute rash

## 2021-03-23 NOTE — Assessment & Plan Note (Signed)
Per endocrinology.  I will defer.  He agrees. 

## 2021-03-23 NOTE — Assessment & Plan Note (Signed)
Flu 2021 Shingles discussed with patient.  Would defer given recent illness. PNA, discussed with patient.  Would defer given recent illness. Tetanus discussed with patient. COVID-vaccine previously done. Cologuard neg 2021 Prostate cancer screening per endo given T replacement.   Advance directive d/w pt. Brother Delta Deshmukh designated if patient were incapacitated.   Cognitive function addressed- see scanned forms- and if abnormal then additional documentation follows.

## 2021-03-23 NOTE — Assessment & Plan Note (Signed)
Presumed bronchitis.  Lungs clear.  Still okay for outpatient follow-up.  He has been on higher dose of hydrocortisone recently, which is his routine when he is ill.  Reasonable to use albuterol in the meantime, start doxycycline, drink plenty fluids and update me as needed.  He agrees.

## 2021-03-23 NOTE — Assessment & Plan Note (Signed)
Insomnia with ambien use at baseline w/o ADE.  He has good sleep hygiene.   Ambien helps.  Would continue as is.  He agrees.

## 2021-03-30 ENCOUNTER — Telehealth: Payer: Self-pay | Admitting: Family Medicine

## 2021-03-30 NOTE — Telephone Encounter (Signed)
Error

## 2021-04-15 DIAGNOSIS — E038 Other specified hypothyroidism: Secondary | ICD-10-CM | POA: Diagnosis not present

## 2021-04-15 DIAGNOSIS — Z125 Encounter for screening for malignant neoplasm of prostate: Secondary | ICD-10-CM | POA: Diagnosis not present

## 2021-04-15 DIAGNOSIS — E291 Testicular hypofunction: Secondary | ICD-10-CM | POA: Diagnosis not present

## 2021-04-15 DIAGNOSIS — D443 Neoplasm of uncertain behavior of pituitary gland: Secondary | ICD-10-CM | POA: Diagnosis not present

## 2021-04-15 DIAGNOSIS — E2749 Other adrenocortical insufficiency: Secondary | ICD-10-CM | POA: Diagnosis not present

## 2021-04-22 DIAGNOSIS — E7800 Pure hypercholesterolemia, unspecified: Secondary | ICD-10-CM | POA: Diagnosis not present

## 2021-04-22 DIAGNOSIS — D443 Neoplasm of uncertain behavior of pituitary gland: Secondary | ICD-10-CM | POA: Diagnosis not present

## 2021-04-22 DIAGNOSIS — Z7952 Long term (current) use of systemic steroids: Secondary | ICD-10-CM | POA: Diagnosis not present

## 2021-04-22 DIAGNOSIS — E2749 Other adrenocortical insufficiency: Secondary | ICD-10-CM | POA: Diagnosis not present

## 2021-04-22 DIAGNOSIS — E291 Testicular hypofunction: Secondary | ICD-10-CM | POA: Diagnosis not present

## 2021-04-22 DIAGNOSIS — R7309 Other abnormal glucose: Secondary | ICD-10-CM | POA: Diagnosis not present

## 2021-04-22 DIAGNOSIS — E038 Other specified hypothyroidism: Secondary | ICD-10-CM | POA: Diagnosis not present

## 2021-04-22 DIAGNOSIS — R251 Tremor, unspecified: Secondary | ICD-10-CM | POA: Diagnosis not present

## 2021-05-04 DIAGNOSIS — E119 Type 2 diabetes mellitus without complications: Secondary | ICD-10-CM | POA: Diagnosis not present

## 2021-05-15 ENCOUNTER — Other Ambulatory Visit: Payer: Self-pay | Admitting: Family Medicine

## 2021-06-03 ENCOUNTER — Other Ambulatory Visit: Payer: Self-pay | Admitting: Family Medicine

## 2021-06-03 NOTE — Telephone Encounter (Signed)
Last Fill or Written Date and Quantity: 12/08/20 #30 w/5 Last Office Visit and Type: 03/22/21 med. wellness Next Office Visit and Type: No future scheduled

## 2021-07-05 ENCOUNTER — Other Ambulatory Visit: Payer: Self-pay

## 2021-07-05 MED ORDER — ALBUTEROL SULFATE HFA 108 (90 BASE) MCG/ACT IN AERS: 2.0000 | INHALATION_SPRAY | Freq: Four times a day (QID) | RESPIRATORY_TRACT | 3 refills | Status: AC | PRN

## 2021-07-21 DIAGNOSIS — E7800 Pure hypercholesterolemia, unspecified: Secondary | ICD-10-CM | POA: Diagnosis not present

## 2021-07-21 DIAGNOSIS — E119 Type 2 diabetes mellitus without complications: Secondary | ICD-10-CM | POA: Diagnosis not present

## 2021-08-02 DIAGNOSIS — E119 Type 2 diabetes mellitus without complications: Secondary | ICD-10-CM | POA: Diagnosis not present

## 2021-08-02 DIAGNOSIS — Z23 Encounter for immunization: Secondary | ICD-10-CM | POA: Diagnosis not present

## 2021-08-02 DIAGNOSIS — D443 Neoplasm of uncertain behavior of pituitary gland: Secondary | ICD-10-CM | POA: Diagnosis not present

## 2021-08-02 DIAGNOSIS — E038 Other specified hypothyroidism: Secondary | ICD-10-CM | POA: Diagnosis not present

## 2021-08-02 DIAGNOSIS — I10 Essential (primary) hypertension: Secondary | ICD-10-CM | POA: Diagnosis not present

## 2021-10-20 DIAGNOSIS — 419620001 Death: Secondary | SNOMED CT | POA: Diagnosis not present

## 2021-10-20 DEATH — deceased

## 2021-10-26 ENCOUNTER — Telehealth: Payer: Self-pay | Admitting: Family Medicine

## 2021-10-26 NOTE — Telephone Encounter (Signed)
[place] called in stated that pt pass away on 00/34/96 and death certificate was sent to [place] [facility] #[phone]

## 2021-10-28 NOTE — Telephone Encounter (Signed)
Please process the chart.  Thanks.

## 2021-10-28 NOTE — Telephone Encounter (Signed)
Called his brother Antony Haste, gave my condolences.  Per his report, downstairs neighbor found patient down, called 911, and patient was declared dead on the scene. No plan for autopsy.  In this type of case, I presume that patient had cardiac death either as a primary or secondary event.  Brother understood and I will work on the death certificate.  I thanked him for taking my call.  He thanked me.   This patient was a kind gentleman and we will be missed.

## 2021-10-28 NOTE — Telephone Encounter (Signed)
I don't have information to fill out the certificate.  I will as soon as possible.   I called and LMOVM at the phone number listed for patient's brother.  Also called the funeral home.  Awaiting calls back.

## 2023-02-05 IMAGING — DX DG CHEST 2V
1 series · 1 of 1 positions shown · non-contrast
Comparison: December 28, 2014.

CLINICAL DATA: Cough.

EXAM:
CHEST - 2 VIEW

[chest pa]
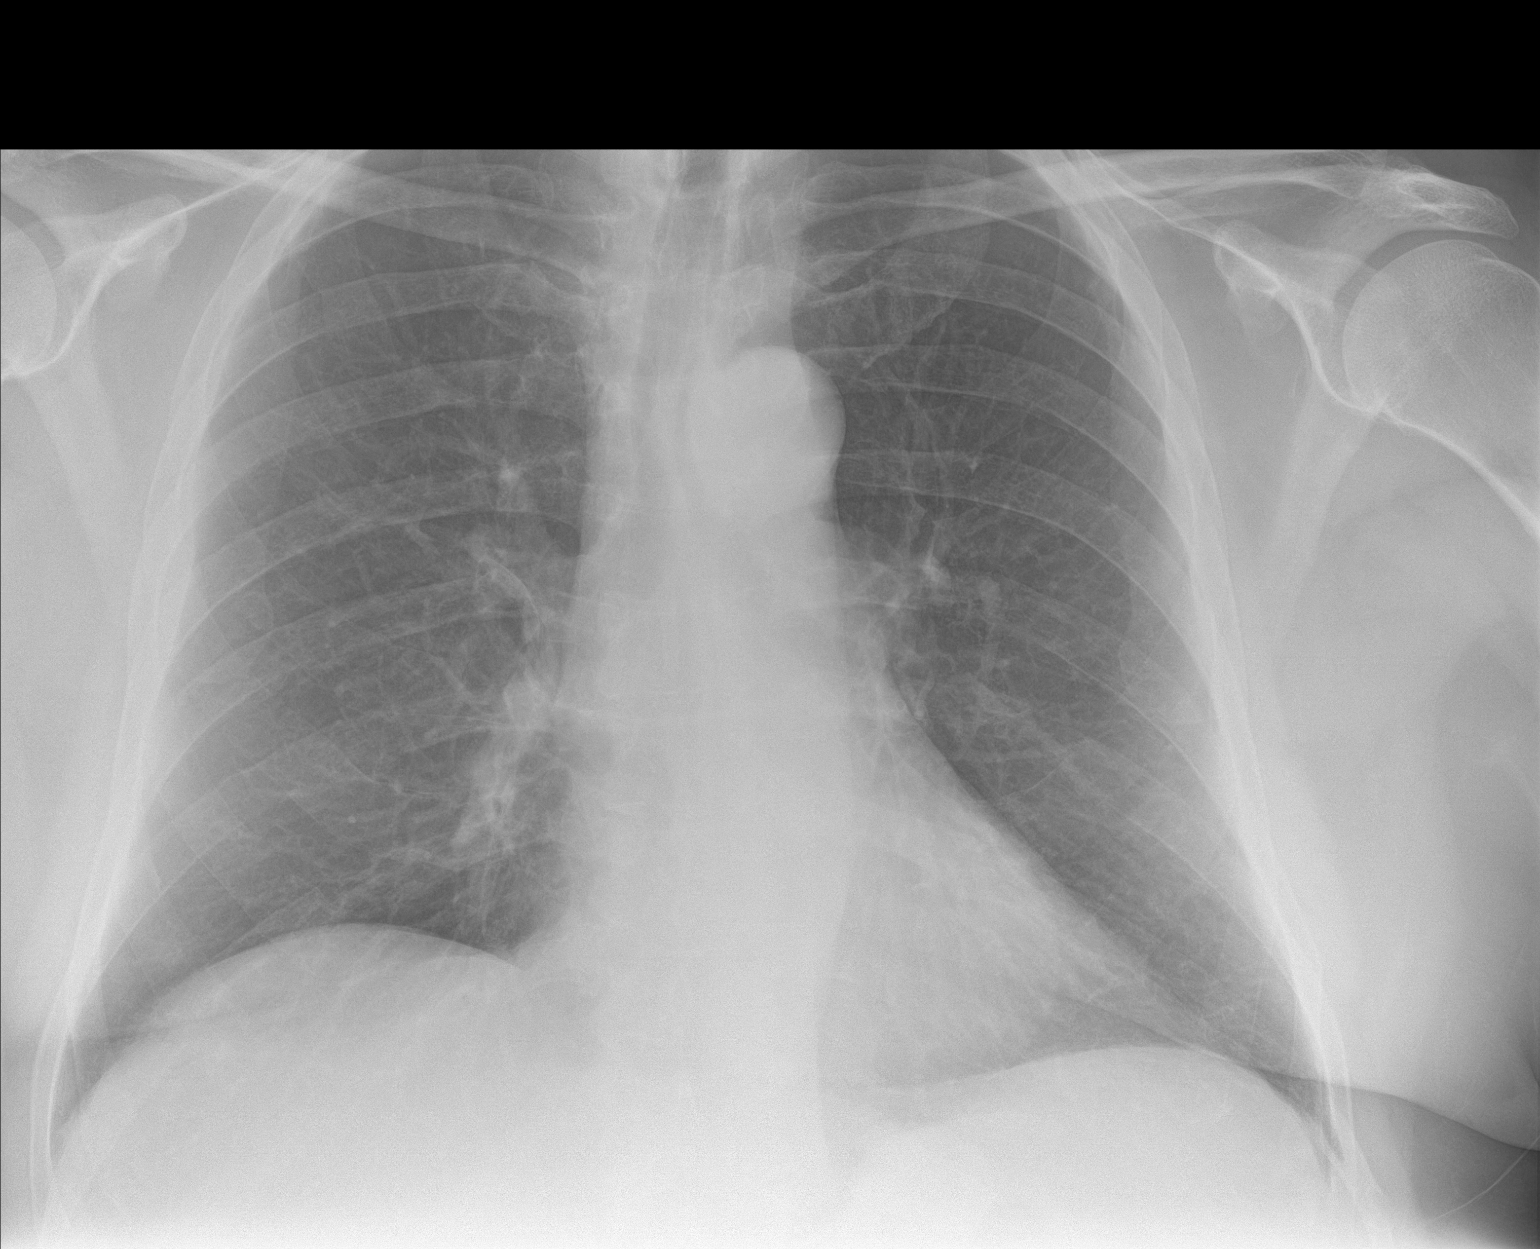

[1 of 1 positions shown; findings below may reference images not displayed]

FINDINGS: The heart size and mediastinal contours are within normal limits.
Both lungs are clear. No visible pleural effusions or pneumothorax.
No acute osseous abnormality.
IMPRESSION: No active cardiopulmonary disease.
